# Patient Record
Sex: Male | Born: 1955 | Race: Black or African American | Hispanic: No | Smoking: Current every day smoker
Health system: Southern US, Community
[De-identification: ages and names within clinical notes are randomized; demographics above are authoritative.]

## PROBLEM LIST (undated history)

## (undated) DIAGNOSIS — Z720 Tobacco use: Secondary | ICD-10-CM

## (undated) DIAGNOSIS — I2510 Atherosclerotic heart disease of native coronary artery without angina pectoris: Secondary | ICD-10-CM

## (undated) DIAGNOSIS — F32A Depression, unspecified: Secondary | ICD-10-CM

## (undated) DIAGNOSIS — F1010 Alcohol abuse, uncomplicated: Secondary | ICD-10-CM

## (undated) DIAGNOSIS — C801 Malignant (primary) neoplasm, unspecified: Secondary | ICD-10-CM

## (undated) DIAGNOSIS — I10 Essential (primary) hypertension: Secondary | ICD-10-CM

## (undated) DIAGNOSIS — F419 Anxiety disorder, unspecified: Secondary | ICD-10-CM

## (undated) DIAGNOSIS — K219 Gastro-esophageal reflux disease without esophagitis: Secondary | ICD-10-CM

## (undated) DIAGNOSIS — F329 Major depressive disorder, single episode, unspecified: Secondary | ICD-10-CM

## (undated) DIAGNOSIS — C9000 Multiple myeloma not having achieved remission: Principal | ICD-10-CM

## (undated) HISTORY — PX: PROSTATECTOMY: SHX69

## (undated) HISTORY — PX: CORONARY STENT PLACEMENT: SHX1402

## (undated) HISTORY — PX: CORONARY ANGIOPLASTY: SHX604

---

## 2014-07-21 ENCOUNTER — Ambulatory Visit (HOSPITAL_COMMUNITY): Admit: 2014-07-21 | Payer: Self-pay | Admitting: Cardiology

## 2014-07-21 ENCOUNTER — Inpatient Hospital Stay (HOSPITAL_COMMUNITY)
Admission: EM | Admit: 2014-07-21 | Discharge: 2014-07-22 | DRG: 287 | Disposition: A | Discharge status: Home / Self Care | Payer: Non-veteran care | Attending: Cardiology | Admitting: Cardiology

## 2014-07-21 ENCOUNTER — Encounter (HOSPITAL_COMMUNITY): Payer: Self-pay | Admitting: Emergency Medicine

## 2014-07-21 ENCOUNTER — Ambulatory Visit (HOSPITAL_COMMUNITY): Admission: EM | Admit: 2014-07-21 | Payer: Self-pay | Source: Home / Self Care | Admitting: Cardiology

## 2014-07-21 ENCOUNTER — Encounter (HOSPITAL_COMMUNITY)
Admission: EM | Disposition: A | Discharge status: Home / Self Care | Payer: Non-veteran care | Source: Home / Self Care | Attending: Cardiology

## 2014-07-21 DIAGNOSIS — I10 Essential (primary) hypertension: Secondary | ICD-10-CM | POA: Diagnosis present

## 2014-07-21 DIAGNOSIS — F419 Anxiety disorder, unspecified: Secondary | ICD-10-CM | POA: Diagnosis present

## 2014-07-21 DIAGNOSIS — E785 Hyperlipidemia, unspecified: Secondary | ICD-10-CM | POA: Diagnosis present

## 2014-07-21 DIAGNOSIS — E860 Dehydration: Secondary | ICD-10-CM | POA: Diagnosis present

## 2014-07-21 DIAGNOSIS — I2510 Atherosclerotic heart disease of native coronary artery without angina pectoris: Secondary | ICD-10-CM | POA: Diagnosis present

## 2014-07-21 DIAGNOSIS — F329 Major depressive disorder, single episode, unspecified: Secondary | ICD-10-CM | POA: Diagnosis present

## 2014-07-21 DIAGNOSIS — F32A Depression, unspecified: Secondary | ICD-10-CM | POA: Diagnosis present

## 2014-07-21 DIAGNOSIS — F17210 Nicotine dependence, cigarettes, uncomplicated: Secondary | ICD-10-CM | POA: Diagnosis present

## 2014-07-21 DIAGNOSIS — K219 Gastro-esophageal reflux disease without esophagitis: Secondary | ICD-10-CM | POA: Diagnosis present

## 2014-07-21 DIAGNOSIS — I252 Old myocardial infarction: Secondary | ICD-10-CM | POA: Diagnosis not present

## 2014-07-21 DIAGNOSIS — Z955 Presence of coronary angioplasty implant and graft: Secondary | ICD-10-CM

## 2014-07-21 DIAGNOSIS — I213 ST elevation (STEMI) myocardial infarction of unspecified site: Secondary | ICD-10-CM

## 2014-07-21 DIAGNOSIS — Z7982 Long term (current) use of aspirin: Secondary | ICD-10-CM

## 2014-07-21 DIAGNOSIS — R079 Chest pain, unspecified: Secondary | ICD-10-CM | POA: Diagnosis present

## 2014-07-21 DIAGNOSIS — F1010 Alcohol abuse, uncomplicated: Secondary | ICD-10-CM | POA: Diagnosis present

## 2014-07-21 DIAGNOSIS — Z720 Tobacco use: Secondary | ICD-10-CM | POA: Diagnosis present

## 2014-07-21 DIAGNOSIS — R55 Syncope and collapse: Secondary | ICD-10-CM | POA: Diagnosis present

## 2014-07-21 HISTORY — DX: Gastro-esophageal reflux disease without esophagitis: K21.9

## 2014-07-21 HISTORY — DX: Alcohol abuse, uncomplicated: F10.10

## 2014-07-21 HISTORY — DX: Major depressive disorder, single episode, unspecified: F32.9

## 2014-07-21 HISTORY — PX: LEFT HEART CATHETERIZATION WITH CORONARY ANGIOGRAM: SHX5451

## 2014-07-21 HISTORY — DX: Tobacco use: Z72.0

## 2014-07-21 HISTORY — DX: Depression, unspecified: F32.A

## 2014-07-21 HISTORY — DX: Anxiety disorder, unspecified: F41.9

## 2014-07-21 HISTORY — DX: Atherosclerotic heart disease of native coronary artery without angina pectoris: I25.10

## 2014-07-21 HISTORY — DX: Malignant (primary) neoplasm, unspecified: C80.1

## 2014-07-21 HISTORY — DX: Essential (primary) hypertension: I10

## 2014-07-21 LAB — CBC
HCT: 25.5 % — ABNORMAL LOW (ref 39.0–52.0)
Hemoglobin: 8.6 g/dL — ABNORMAL LOW (ref 13.0–17.0)
MCH: 27.7 pg (ref 26.0–34.0)
MCHC: 33.7 g/dL (ref 30.0–36.0)
MCV: 82.3 fL (ref 78.0–100.0)
PLATELETS: 281 10*3/uL (ref 150–400)
RBC: 3.1 MIL/uL — AB (ref 4.22–5.81)
RDW: 15.9 % — ABNORMAL HIGH (ref 11.5–15.5)
WBC: 5 10*3/uL (ref 4.0–10.5)

## 2014-07-21 LAB — PROTIME-INR
INR: 1.14 (ref 0.00–1.49)
Prothrombin Time: 14.7 seconds (ref 11.6–15.2)

## 2014-07-21 LAB — PRO B NATRIURETIC PEPTIDE: Pro B Natriuretic peptide (BNP): 19 pg/mL (ref 0–125)

## 2014-07-21 LAB — I-STAT TROPONIN, ED: Troponin i, poc: 0.02 ng/mL (ref 0.00–0.08)

## 2014-07-21 SURGERY — LEFT HEART CATHETERIZATION WITH CORONARY ANGIOGRAM
Anesthesia: Choice | Laterality: Bilateral

## 2014-07-21 MED ORDER — MIDAZOLAM HCL 2 MG/2ML IJ SOLN
INTRAMUSCULAR | Status: AC
  Filled 2014-07-21: qty 2

## 2014-07-21 MED ORDER — NITROGLYCERIN 1 MG/10 ML FOR IR/CATH LAB
INTRA_ARTERIAL | Status: AC
  Filled 2014-07-21: qty 10

## 2014-07-21 MED ORDER — BIVALIRUDIN 250 MG IV SOLR
INTRAVENOUS | Status: AC
  Filled 2014-07-21: qty 250

## 2014-07-21 MED ORDER — LIDOCAINE HCL (PF) 1 % IJ SOLN
INTRAMUSCULAR | Status: AC
  Filled 2014-07-21: qty 30

## 2014-07-21 MED ORDER — HEPARIN (PORCINE) IN NACL 2-0.9 UNIT/ML-% IJ SOLN
INTRAMUSCULAR | Status: AC
  Filled 2014-07-21: qty 1000

## 2014-07-21 MED ORDER — HEPARIN SODIUM (PORCINE) 5000 UNIT/ML IJ SOLN
INTRAMUSCULAR | Status: AC
  Administered 2014-07-21: 5000 [IU]
  Filled 2014-07-21: qty 1

## 2014-07-21 MED ORDER — FENTANYL CITRATE 0.05 MG/ML IJ SOLN
INTRAMUSCULAR | Status: AC
  Filled 2014-07-21: qty 2

## 2014-07-21 NOTE — ED Provider Notes (Signed)
MSE was initiated and I personally evaluated the patient and placed orders (if any) at  11:24 PM on July 21, 2014.  PT has hx of CAD with 5 stents, the last placed about 5 years ago. Presents with chest pain that started about 10 pm tonight, left chest, with diaphoresis but no nausea or vomiting. EMS gave 324 mg of ASA and NTG x 1 and his pain improved from an 8 to an 2. EMS reports syncope x 2.  PT continues to smoke 1/2-1 ppd and drink beer.   PT is alert, cooperative. He is in no respiratory distress. Rest of exam deferred to Dr Ellyn Hack.   Dr Ellyn Hack was in the room awaiting patient's arrival to the ED and patient's care was deferred to him.     EKG Interpretation  Date/Time:  Thursday July 21 2014 23:24:57 EDT Ventricular Rate:  66 PR Interval:  166 QRS Duration: 102 QT Interval:  453 QTC Calculation: 475 R Axis:   -9 Text Interpretation:  Sinus rhythm Left ventricular hypertrophy Lateral infarct, acute (LAD) No old tracing to compare Confirmed by Chancelor Hardrick  MD-I, Cagney Steenson ([id number]) on 07/21/2014 11:33:01 PM      Diagnoses that have been ruled out:  None  Diagnoses that are still under consideration:  None  Final diagnoses:  ST elevation myocardial infarction (STEMI), unspecified artery    Plan pt taken to cath lab by Dr Luna Glasgow, MD, FACEP   The patient appears stable so that the remainder of the MSE may be completed by another provider.  Janice Norrie, MD 07/21/14 [phone]

## 2014-07-21 NOTE — ED Notes (Addendum)
Pt arrives from [employer], with c/o chest pain, syncopal episode while in the kitchen eating and started having cp. Orthostatic changes and syncopal episode again with EMS. Dizziness. One nitro SL, 324 MG ASA PTA. Initial pain was an eight. Now a two

## 2014-07-21 NOTE — Progress Notes (Signed)
Chaplain responded to code stemi. Chaplain met two of patient's friends, showed them to cath lab waiting area, and let staff know they had arrived. Will follow as needed.  Vanetta Mulders 07/21/2014 11:50 PM

## 2014-07-21 NOTE — ED Notes (Signed)
Pt transported to Cath Lab with this RN and Ellyn Hack MD

## 2014-07-22 ENCOUNTER — Encounter (HOSPITAL_COMMUNITY): Payer: Self-pay | Admitting: *Deleted

## 2014-07-22 ENCOUNTER — Inpatient Hospital Stay (HOSPITAL_COMMUNITY): Payer: Non-veteran care

## 2014-07-22 DIAGNOSIS — K219 Gastro-esophageal reflux disease without esophagitis: Secondary | ICD-10-CM | POA: Diagnosis present

## 2014-07-22 DIAGNOSIS — Z720 Tobacco use: Secondary | ICD-10-CM | POA: Diagnosis present

## 2014-07-22 DIAGNOSIS — R55 Syncope and collapse: Principal | ICD-10-CM

## 2014-07-22 DIAGNOSIS — F32A Depression, unspecified: Secondary | ICD-10-CM | POA: Diagnosis present

## 2014-07-22 DIAGNOSIS — R079 Chest pain, unspecified: Secondary | ICD-10-CM

## 2014-07-22 DIAGNOSIS — F419 Anxiety disorder, unspecified: Secondary | ICD-10-CM | POA: Diagnosis present

## 2014-07-22 DIAGNOSIS — F1010 Alcohol abuse, uncomplicated: Secondary | ICD-10-CM | POA: Diagnosis present

## 2014-07-22 DIAGNOSIS — I10 Essential (primary) hypertension: Secondary | ICD-10-CM | POA: Diagnosis present

## 2014-07-22 DIAGNOSIS — I2510 Atherosclerotic heart disease of native coronary artery without angina pectoris: Secondary | ICD-10-CM | POA: Diagnosis present

## 2014-07-22 DIAGNOSIS — F329 Major depressive disorder, single episode, unspecified: Secondary | ICD-10-CM | POA: Diagnosis present

## 2014-07-22 LAB — BASIC METABOLIC PANEL
ANION GAP: 15 (ref 5–15)
Anion gap: 12 (ref 5–15)
BUN: 12 mg/dL (ref 6–23)
BUN: 13 mg/dL (ref 6–23)
CALCIUM: 9.1 mg/dL (ref 8.4–10.5)
CO2: 19 mEq/L (ref 19–32)
CO2: 22 meq/L (ref 19–32)
Calcium: 8.9 mg/dL (ref 8.4–10.5)
Chloride: 100 mEq/L (ref 96–112)
Chloride: 102 mEq/L (ref 96–112)
Creatinine, Ser: 1.44 mg/dL — ABNORMAL HIGH (ref 0.50–1.35)
Creatinine, Ser: 1.36 mg/dL — ABNORMAL HIGH (ref 0.50–1.35)
GFR calc Af Amer: 60 mL/min — ABNORMAL LOW (ref >90)
GFR calc Af Amer: 64 mL/min — ABNORMAL LOW (ref >90)
GFR, EST NON AFRICAN AMERICAN: 51 mL/min — AB (ref >90)
GFR, EST NON AFRICAN AMERICAN: 55 mL/min — AB (ref >90)
GLUCOSE: 117 mg/dL — AB (ref 70–99)
Glucose, Bld: 100 mg/dL — ABNORMAL HIGH (ref 70–99)
POTASSIUM: 4.3 meq/L (ref 3.7–5.3)
Potassium: 4.5 mEq/L (ref 3.7–5.3)
SODIUM: 134 meq/L — AB (ref 137–147)
SODIUM: 136 meq/L — AB (ref 137–147)

## 2014-07-22 LAB — CBC WITH DIFFERENTIAL/PLATELET
Basophils Absolute: 0 10*3/uL (ref 0.0–0.1)
Basophils Relative: 0 % (ref 0–1)
EOS ABS: 0.2 10*3/uL (ref 0.0–0.7)
EOS PCT: 2 % (ref 0–5)
HEMATOCRIT: 27.2 % — AB (ref 39.0–52.0)
Hemoglobin: 8.9 g/dL — ABNORMAL LOW (ref 13.0–17.0)
Lymphocytes Relative: 16 % (ref 12–46)
Lymphs Abs: 1.8 10*3/uL (ref 0.7–4.0)
MCH: 27.6 pg (ref 26.0–34.0)
MCHC: 32.7 g/dL (ref 30.0–36.0)
MCV: 84.5 fL (ref 78.0–100.0)
MONO ABS: 0.7 10*3/uL (ref 0.1–1.0)
Monocytes Relative: 7 % (ref 3–12)
Neutro Abs: 8.2 10*3/uL — ABNORMAL HIGH (ref 1.7–7.7)
Neutrophils Relative %: 75 % (ref 43–77)
PLATELETS: 301 10*3/uL (ref 150–400)
RBC: 3.22 MIL/uL — ABNORMAL LOW (ref 4.22–5.81)
RDW: 16 % — AB (ref 11.5–15.5)
WBC: 10.9 10*3/uL — ABNORMAL HIGH (ref 4.0–10.5)

## 2014-07-22 LAB — TROPONIN I
Troponin I: <0.3 ng/mL (ref <0.30)
Troponin I: 0.32 ng/mL (ref <0.30)

## 2014-07-22 LAB — TSH: TSH: 0.875 u[IU]/mL (ref 0.350–4.500)

## 2014-07-22 LAB — MRSA PCR SCREENING: MRSA BY PCR: POSITIVE — AB

## 2014-07-22 LAB — VITAMIN B12: VITAMIN B 12: 540 pg/mL (ref 211–911)

## 2014-07-22 LAB — ETHANOL: ALCOHOL ETHYL (B): 12 mg/dL — AB (ref 0–11)

## 2014-07-22 LAB — FERRITIN: Ferritin: 14 ng/mL — ABNORMAL LOW (ref 22–322)

## 2014-07-22 SURGERY — Surgical Case
Anesthesia: *Unknown

## 2014-07-22 MED ORDER — ATORVASTATIN CALCIUM 80 MG PO TABS
80.0000 mg | ORAL_TABLET | Freq: Every day | ORAL | Status: DC
  Filled 2014-07-22: qty 1

## 2014-07-22 MED ORDER — SODIUM CHLORIDE 0.9 % IV SOLN: 250.0000 mL | INTRAVENOUS | Status: DC | PRN

## 2014-07-22 MED ORDER — NITROGLYCERIN 0.4 MG SL SUBL: 0.4000 mg | SUBLINGUAL_TABLET | SUBLINGUAL | Status: AC | PRN

## 2014-07-22 MED ORDER — ASPIRIN EC 81 MG PO TBEC
81.0000 mg | DELAYED_RELEASE_TABLET | Freq: Every day | ORAL | Status: DC
  Administered 2014-07-22: 81 mg via ORAL
  Filled 2014-07-22: qty 1

## 2014-07-22 MED ORDER — ADULT MULTIVITAMIN W/MINERALS CH: 1.0000 | ORAL_TABLET | Freq: Every day | ORAL | Status: DC

## 2014-07-22 MED ORDER — MUPIROCIN 2 % EX OINT
1.0000 | TOPICAL_OINTMENT | Freq: Two times a day (BID) | CUTANEOUS | Status: DC
Start: 2014-07-22 — End: 2014-07-22
  Administered 2014-07-22: 1 via NASAL
  Filled 2014-07-22: qty 22

## 2014-07-22 MED ORDER — SODIUM CHLORIDE 0.9 % IV SOLN
INTRAVENOUS | Status: DC
Start: 2014-07-22 — End: 2014-07-22

## 2014-07-22 MED ORDER — SODIUM CHLORIDE 0.9 % IJ SOLN: 3.0000 mL | INTRAMUSCULAR | Status: DC | PRN

## 2014-07-22 MED ORDER — SODIUM CHLORIDE 0.9 % IJ SOLN
3.0000 mL | Freq: Two times a day (BID) | INTRAMUSCULAR | Status: DC
  Administered 2014-07-22 (×2): 3 mL via INTRAVENOUS

## 2014-07-22 MED ORDER — MORPHINE SULFATE 2 MG/ML IJ SOLN: 2.0000 mg | INTRAMUSCULAR | Status: DC | PRN

## 2014-07-22 MED ORDER — NITROGLYCERIN 0.4 MG SL SUBL: 0.4000 mg | SUBLINGUAL_TABLET | SUBLINGUAL | Status: DC | PRN

## 2014-07-22 MED ORDER — AMLODIPINE BESYLATE 10 MG PO TABS
10.0000 mg | ORAL_TABLET | Freq: Every day | ORAL | Status: DC
  Administered 2014-07-22: 10 mg via ORAL
  Filled 2014-07-22: qty 1

## 2014-07-22 MED ORDER — SODIUM CHLORIDE 0.9 % IJ SOLN
3.0000 mL | Freq: Two times a day (BID) | INTRAMUSCULAR | Status: DC
  Administered 2014-07-22: 3 mL via INTRAVENOUS

## 2014-07-22 MED ORDER — LISINOPRIL 10 MG PO TABS: 10.0000 mg | ORAL_TABLET | Freq: Every day | ORAL | Status: AC

## 2014-07-22 MED ORDER — VERAPAMIL HCL 2.5 MG/ML IV SOLN
INTRAVENOUS | Status: AC
  Filled 2014-07-22: qty 2

## 2014-07-22 MED ORDER — LISINOPRIL 10 MG PO TABS
10.0000 mg | ORAL_TABLET | Freq: Every day | ORAL | Status: DC
  Administered 2014-07-22: 10 mg via ORAL
  Filled 2014-07-22: qty 1

## 2014-07-22 MED ORDER — ENOXAPARIN SODIUM 40 MG/0.4ML ~~LOC~~ SOLN
40.0000 mg | Freq: Every day | SUBCUTANEOUS | Status: DC
  Administered 2014-07-22: 40 mg via SUBCUTANEOUS
  Filled 2014-07-22: qty 0.4

## 2014-07-22 MED ORDER — METOPROLOL TARTRATE 25 MG PO TABS: 25.0000 mg | ORAL_TABLET | Freq: Two times a day (BID) | ORAL | Status: AC

## 2014-07-22 MED ORDER — ADULT MULTIVITAMIN W/MINERALS CH
1.0000 | ORAL_TABLET | Freq: Every day | ORAL | Status: DC
  Administered 2014-07-22: 1 via ORAL
  Filled 2014-07-22: qty 1

## 2014-07-22 MED ORDER — SERTRALINE HCL 50 MG PO TABS
50.0000 mg | ORAL_TABLET | Freq: Every day | ORAL | Status: DC
  Administered 2014-07-22: 50 mg via ORAL
  Filled 2014-07-22: qty 1

## 2014-07-22 MED ORDER — HEPARIN SODIUM (PORCINE) 5000 UNIT/ML IJ SOLN: 5000.0000 [IU] | Freq: Three times a day (TID) | INTRAMUSCULAR | Status: DC

## 2014-07-22 MED ORDER — CHLORHEXIDINE GLUCONATE CLOTH 2 % EX PADS: 6.0000 | MEDICATED_PAD | Freq: Every day | CUTANEOUS | Status: DC

## 2014-07-22 MED ORDER — METOPROLOL TARTRATE 25 MG PO TABS
25.0000 mg | ORAL_TABLET | Freq: Two times a day (BID) | ORAL | Status: DC
  Administered 2014-07-22 (×2): 25 mg via ORAL
  Filled 2014-07-22 (×3): qty 1

## 2014-07-22 MED ORDER — ASPIRIN 81 MG PO TBEC: 81.0000 mg | DELAYED_RELEASE_TABLET | Freq: Every day | ORAL | Status: AC

## 2014-07-22 MED ORDER — SODIUM CHLORIDE 0.9 % IV SOLN: INTRAVENOUS | Status: DC

## 2014-07-22 MED ORDER — ACETAMINOPHEN 325 MG PO TABS: 650.0000 mg | ORAL_TABLET | ORAL | Status: DC | PRN

## 2014-07-22 MED ORDER — AMLODIPINE BESYLATE 10 MG PO TABS: 10.0000 mg | ORAL_TABLET | Freq: Every day | ORAL | Status: AC

## 2014-07-22 MED ORDER — ATORVASTATIN CALCIUM 80 MG PO TABS
80.0000 mg | ORAL_TABLET | Freq: Every day | ORAL | Status: AC
Start: 2014-07-22 — End: ?

## 2014-07-22 MED ORDER — ADULT MULTIVITAMIN W/MINERALS CH: 1.0000 | ORAL_TABLET | Freq: Every day | ORAL | Status: AC

## 2014-07-22 MED ORDER — ONDANSETRON HCL 4 MG/2ML IJ SOLN: 4.0000 mg | Freq: Four times a day (QID) | INTRAMUSCULAR | Status: DC | PRN

## 2014-07-22 MED ORDER — SERTRALINE HCL 50 MG PO TABS: 50.0000 mg | ORAL_TABLET | Freq: Every day | ORAL | Status: AC

## 2014-07-22 MED FILL — Sodium Chloride IV Soln 0.9%: INTRAVENOUS | Qty: 50 | Status: AC

## 2014-07-22 NOTE — CV Procedure (Signed)
CARDIAC CATHETERIZATION REPORT  NAME:  Jeffrey Peterson   MRN: [id number] DOB:  Feb 09, 1956   ADMIT DATE: 07/21/2014 Procedure Date: 07/22/2014  INTERVENTIONAL CARDIOLOGIST: Leonie Man, M.D., MS PRIMARY CARE PROVIDER: No primary provider on file. PRIMARY CARDIOLOGIST: [place]  PATIENT:  Jeffrey Peterson is a 58 y.o. male history no CAD - by his report, he has 5 stents but he does not know the location or type. His last catheterization and stent was 5 years ago. He also has hypertension, hyperlipidemia obesity and is a chronic smoker of at least 3/4 pack per day. He was in his usual state of health visiting friends in [place] when he had a syncopal episode roughly 10:15 PM. Shortly after arising from this episode he started noticing left-sided/substernal chest tightness and pressure. He was somewhat sweaty and clammy but denied any dyspnea or nausea. The syncopal episode left roughly 3 minutes loss of consciousness by bystanders report. There was no sensory palpitations or irregular/rapid heartbeats prior to this. He was seated at a bar drinking. He had chest pain EMS was contacted. Upon arrival he had. The roughly 1 mm ST elevations in leads 1 aVL changes in 3 and aVF that was concerning for possible Lateral STEMI.  [facility] identified the EKG as a STEMI, and activated Code STEMI.  He [facility] at [facility] emergency room at 11:15. The pain was down to roughly 2/10. Per EMS he was very orthostatic and dizzy upon their initial evaluation. When he arrived in her room he was also somewhat orthostatic with sitting up he only began feeling better when he would lie back down again.. Based on the EKG changes, his known history of CAD with a history of syncope and chest pain, I decided to the cardiac catheterization for invasive evaluation.  PRE-OPERATIVE DIAGNOSIS:    Syncope  Possible Lateral ST Elevation MI  Known CAD Status Post PCI  PROCEDURES PERFORMED:      Left Heart Catheterization with Native Coronary Angiography  via Right Radial Artery   Left Ventriculography  PROCEDURE: The patient was brought to the [facility] in the fasting state and prepped and draped in the usual sterile fashion for Right Radial artery access. A modified Allen's test was performed on the right wrist demonstrating excellent collateral flow for radial access.   Sterile technique was used including antiseptics, cap, gloves, gown, hand hygiene, mask and sheet. Skin prep: Chlorhexidine.   Consent: Risks of procedure as well as the alternatives and risks of each were explained to the (patient/caregiver). Consent for procedure obtained.   Time Out: Verified patient identification, verified procedure, site/side was marked, verified correct patient position, special equipment/implants available, medications/allergies/relevent history reviewed, required imaging and test results available. Performed.  Access:   Right Radial Artery: 6 Fr Sheath -  Seldinger Technique (Angiocath Micropuncture Kit)  Radial Cocktail - 10 mL;   Left Heart Catheterization: 5 Fr Catheters advanced or exchanged over a Long Exchange Safety J-wire; TIG 4.0 catheter advanced first.  Left & Right Coronary Artery Cineangiography: TIG 4.0 Catheter   LV Hemodynamics (LV Gram): Angled Pigtail  Sheath removed in the cardiac catheterization lab with TR band placement for hemostasis.  TR Band: 0015  Hours; 13 mL air  FINDINGS:  Hemodynamics:   Central Aortic Pressure / Mean: 121/69/93 mmHg  Left Ventricular Pressure / LVEDP: 121/9/15 mmHg  Left Ventriculography:  EF: 70-75 %  Wall Motion: Hyperdynamic  Coronary Anatomy: Large caliber vessels,  very difficult to fully opacify with contrast  Dominance: Right  Left Main:  Large caliber vessel that bifurcates into the LAD and Circumflex; angiographically normal; there does appear to be a small caliber proximal  septal perforator versus ramus intermedius the does not fill well. LAD: Large caliber vessel that gives off 2 major diagonal branches in the early proximal to mid section. There appears to be a stent crosses the second diagonal branch. It is widely patent. The vessel continues as a large caliber vessel that wraps down around the apex perfusing the distal one third of the infero-apex. Essentially angiographically normal.  D1: Moderate caliber vessel with a proximal bifurcation. One of the branches of the bifurcation is probably 50% stenosis. This more proximal branch covers a larger distribution than the second branch.  D2: Smaller moderate caliber vessel that bifurcates distally. Minimal luminal irregularities.  Left Circumflex: Large caliber, nondominant vessel that gives off a small AV groove branch before terminating as a large lateral OM. The lateral OM has proximal 40% stenosis before and after a small proximal branch. It then bifurcates into 2 moderate caliber branches distally. There does appear to be a widely patent stent in between that proximal branch and the bifurcation. Otherwise minimal luminal irregularities noted.   RCA: Large caliber, dominant vessel that gives off a major moderate caliber artery and marginal branch. The RCA based distally into the Right Posterior AV Groove Branch (RPAV) In the Right Posterior Descending Artery (RPDA). There are mild luminal irregularities throughout the RCA but no significant lesions. Overall the distal branches of the RCA are relatively small in comparison to the left coronary system branches.  RPDA: Moderate caliber vessel with an ostial 60% stenosis. The vessel reaches about half way to the apex.  RPL Sysytem:The RPAV begins a moderate caliber vessel and gives off several small posterolateral branches.  After reviewing the initial angiography, no culprit lesion was identified.  MEDICATIONS:  Anesthesia:  Local Lidocaine 2 ml  Sedation:  1 mg  IV Versed, 25 mcg IV fentanyl ;   Premedication: 4000 Units IV Heparin, 324 mg ASA  Omnipaque Contrast: 100 ml Radial Cocktail: 5 mg Verapamil, 400 mcg NTG, 2 ml 2% Lidocaine in 10 ml NS 250 normal saline bolus  PATIENT DISPOSITION:    The patient was transferred to the PACU holding area in a hemodynamicaly stable, chest pain free condition.  The patient tolerated the procedure well, and there were no complications.  EBL:   < 5 ml  The patient was stable before, during, and after the procedure.  POST-OPERATIVE DIAGNOSIS:    Angiographically only moderate disease as with roughly 60% ostial PDA and a 60% stenosis in the bifurcation of D1  Hyperdynamic left ventricular function with mildly elevated LVEDP; After IV hydration.    PLAN OF CARE:  Admit to Stepdown Unit for postcatheterization care and evaluation of syncope.  Ambulate in the morning after IV hydration to see if he continues to be symptomatic with orthostatic hypotension.  Check orthostatic blood pressures in the morning.    Leonie Man, M.D., M.S. Interventional Cardiologist   Pager # [phone]

## 2014-07-22 NOTE — Discharge Summary (Signed)
Discharge Summary   Patient ID: Jeffrey Peterson,  MRN: [id number], DOB/AGE: 1956/02/19 58 y.o.  Admit date: 07/21/2014 Discharge date: 07/22/2014  Primary Care Provider: No PCP Per Patient Primary Cardiologist: [employer], [place]  Discharge Diagnoses Principal Problem:   Syncope and collapse Active Problems:   CAD (coronary artery disease)   Hypertension   Tobacco abuse   GERD (gastroesophageal reflux disease)   Depression   Anxiety   ETOH abuse  Allergies No Known Allergies  Procedures  Cardiac Catheterization 10.15.2015  Hemodynamics:    Central Aortic Pressure / Mean: 121/69/93 mmHg  Left Ventricular Pressure / LVEDP: 121/9/15 mmHg  Left Ventriculography:  EF: 70-75 %  Wall Motion: Hyperdynamic  Coronary Anatomy: Large caliber vessels, very difficult to fully opacify with contrast  Dominance: Right  Left Main:  Large caliber vessel that bifurcates into the LAD and Circumflex; angiographically normal; there does appear to be a small caliber proximal septal perforator versus ramus intermedius the does not fill well. LAD: Large caliber vessel that gives off 2 major diagonal branches in the early proximal to mid section. There appears to be a stent crosses the second diagonal branch. It is widely patent. The vessel continues as a large caliber vessel that wraps down around the apex perfusing the distal one third of the infero-apex. Essentially angiographically normal.  D1: Moderate caliber vessel with a proximal bifurcation. One of the branches of the bifurcation is probably 50% stenosis. This more proximal branch covers a larger distribution than the second branch.  D2: Smaller moderate caliber vessel that bifurcates distally. Minimal luminal irregularities.  Left Circumflex: Large caliber, nondominant vessel that gives off a small AV groove branch before terminating as a large lateral OM. The lateral OM has proximal 40% stenosis before and after a small  proximal branch. It then bifurcates into 2 moderate caliber branches distally. There does appear to be a widely patent stent in between that proximal branch and the bifurcation. Otherwise minimal luminal irregularities noted.     RCA: Large caliber, dominant vessel that gives off a major moderate caliber artery and marginal branch. The RCA based distally into the Right Posterior AV Groove Branch (RPAV) In the Right Posterior Descending Artery (RPDA). There are mild luminal irregularities throughout the RCA but no significant lesions. Overall the distal branches of the RCA are relatively small in comparison to the left coronary system branches.  RPDA: Moderate caliber vessel with an ostial 60% stenosis. The vessel reaches about half way to the apex.  RPL Sysytem:The RPAV begins a moderate caliber vessel and gives off several small posterolateral branches.  After reviewing the initial angiography, no culprit lesion was identified. _____________   History of Present Illness  58 y/o male with a h/o CAD s/p multiple PCI's and followed by cardiology in [place], [place].  He was in his usual state of health until the evening of 10/15, when he was at home sitting in his kitchen and suddenly lost consciousness without warning.  He regained consciousness while on the floor and developed substernal chest pain, which was similar to prior MI symptoms.  EMS was called and upon their arrival, he sat up and had recurrent syncope.  He was treated with IV fluids and taken to the [facility] ED.  In the ED, he was found to have J point elevation in leads I, aVL, and V@ with TWI in III and aVF.  A Code STEMI was called and the cath lab was activated.  Hospital Course  Patient underwent emergent  diagnostic catheterization revealing non-obstructive CAD and normal LV function.  No targets for intervention were noted.  He was transferred to the coronary ICU and continued on medical therapy.  Labs revealed mild normocytic anemia,  renal insufficiency, and mild troponin elevation to 0.32.  This was felt to be secondary to demand ischemia in the setting of syncope/hypotension/dehydration.  The pts wife reported that the pt has been drinking heavily recently - likely contributing to his syncope.    Pt ambulated this AM and was felt to be stable for discharge.  He was counseled on ETOH cessation.  He reports that he has chronic anemia and is followed by his PCP in [place].  He will also f/u with his primary cardiologist in [place].   Discharge Vitals Blood pressure 179/90, pulse 77, temperature 97.4 F (36.3 C), temperature source Oral, resp. rate 20, height 5\' 10"  (1.778 m), weight 235 lb 10.8 oz (106.9 kg), SpO2 100.00%.  Filed Weights   07/21/14 2324 07/22/14 0033 07/22/14 0300  Weight: 246 lb (111.585 kg) 237 lb 3.4 oz (107.6 kg) 235 lb 10.8 oz (106.9 kg)   Labs  CBC  Recent Labs  07/21/14 2326 07/22/14 0120  WBC 5.0 10.9*  NEUTROABS  --  8.2*  HGB 8.6* 8.9*  HCT 25.5* 27.2*  MCV 82.3 84.5  PLT 281 782   Basic Metabolic Panel  Recent Labs  07/21/14 2326 07/22/14 0740  NA 134* 136*  K 4.3 4.5  CL 100 102  CO2 19 22  GLUCOSE 100* 117*  BUN 12 13  CREATININE 1.44* 1.36*  CALCIUM 9.1 8.9   Cardiac Enzymes  Recent Labs  07/22/14 0120 07/22/14 0740  TROPONINI <0.30 0.32*   Thyroid Function Tests  Recent Labs  07/22/14 0740  TSH 0.875   Disposition  Pt is being discharged home today in good condition.  Follow-up Plans & Appointments      Follow-up Information   Follow up with [facility]. (2-3 wks.)       Discharge Medications    Medication List         amLODipine 10 MG tablet  Commonly known as:  NORVASC  Take 1 tablet (10 mg total) by mouth daily.     aspirin 81 MG EC tablet  Take 1 tablet (81 mg total) by mouth daily.     atorvastatin 80 MG tablet  Commonly known as:  LIPITOR  Take 1 tablet (80 mg total) by mouth daily at 6 PM.     lisinopril 10 MG  tablet  Commonly known as:  PRINIVIL,ZESTRIL  Take 1 tablet (10 mg total) by mouth daily.     metoprolol tartrate 25 MG tablet  Commonly known as:  LOPRESSOR  Take 1 tablet (25 mg total) by mouth 2 (two) times daily.     multivitamin with minerals Tabs tablet  Take 1 tablet by mouth daily.     nitroGLYCERIN 0.4 MG SL tablet  Commonly known as:  NITROSTAT  Place 1 tablet (0.4 mg total) under the tongue every 5 (five) minutes x 3 doses as needed for chest pain.     sertraline 50 MG tablet  Commonly known as:  ZOLOFT  Take 1 tablet (50 mg total) by mouth daily.       Outstanding Labs/Studies  None  Duration of Discharge Encounter   Greater than 30 minutes including physician time.  SignedMurray Hodgkins NP 07/22/2014, 12:05 PM

## 2014-07-22 NOTE — Discharge Instructions (Signed)

## 2014-07-22 NOTE — Progress Notes (Signed)
Patient discharged home, patient did walk once more with Cardiac Rehab RN, tolerated well, VSS.  Discharge instructions given:  -Meds were reviewed -Patient was giving smoking cessation education -Patient was instructed on using his  Home BP monitor and to monitor is BP before and after BP meds -Patient was instructed on obtaining MD appts with his PCP and Cardiologist back home  Patient tolerated discharge

## 2014-07-22 NOTE — Care Management Note (Signed)
    Page 1 of 1   07/22/2014     8:30:12 AM CARE MANAGEMENT NOTE 07/22/2014  Patient:  NIRVAN, LABAN   Account Number:  [id number]  Date Initiated:  07/22/2014  Documentation initiated by:  Elissa Hefty  Subjective/Objective Assessment:   adm w syncope ,stemi called in ed     Action/Plan:   lives alone   Anticipated DC Date:     Anticipated DC Plan:           Choice offered to / List presented to:             Status of service:   Medicare Important Message given?   (If response is "NO", the following Medicare IM given date fields will be blank) Date Medicare IM given:   Medicare IM given by:   Date Additional Medicare IM given:   Additional Medicare IM given by:    Discharge Disposition:    Per UR Regulation:  Reviewed for med. necessity/level of care/duration of stay  If discussed at [place] of Stay Meetings, dates discussed:    Comments:

## 2014-07-22 NOTE — Progress Notes (Signed)
CARDIAC REHAB PHASE I   PRE:  Rate/Rhythm: 64 SR  BP:  Supine:   Sitting: 163/77  Standing: 187/95   SaO2: 100%RA  MODE:  Ambulation: 700 ft   POST:  Rate/Rhythm: 80 SR  BP:  Supine:   Sitting: 183/87, 185/84  Standing:    SaO2:  1105-1155 Pt walked 700 ft with steady pace independently with wife and we monitored heart rate. Pt denied dizziness. BP elevated before and after walk and RN aware. Discussed with pt CRP 2 which he has attended before in [place]. Pt gave permission to refer to [facility] as he considers re attending. Gave smoking cessation handouts and pt stated plans to quit cold [place]. Pt also stated he plans to decrease alcohol and work on decreasing stress. Wife is nutritionist and to help him with healthier eating.    [name], RN BSN  07/22/2014 11:49 AM

## 2014-07-22 NOTE — Progress Notes (Addendum)
Patient Name: Jeffrey Peterson Date of Encounter: 07/22/2014     Active Problems:   Syncope and collapse    SUBJECTIVE  Feels well this am. Slightly "woozy" when he sits up. Orthostatic BP checks show no drop in BP. No chest pain or dyspnea.  CURRENT MEDS . amLODipine  10 mg Oral Daily  . aspirin EC  81 mg Oral Daily  . atorvastatin  80 mg Oral q1800  . Chlorhexidine Gluconate Cloth  6 each Topical Q0600  . enoxaparin (LOVENOX) injection  40 mg Subcutaneous Daily  . metoprolol tartrate  25 mg Oral BID  . multivitamin with minerals  1 tablet Oral Daily  . mupirocin ointment  1 application Nasal BID  . sertraline  50 mg Oral Daily  . sodium chloride  3 mL Intravenous Q12H  . sodium chloride  3 mL Intravenous Q12H    OBJECTIVE  Filed Vitals:   07/22/14 0719 07/22/14 0721 07/22/14 0728 07/22/14 0800  BP: 152/76 162/66 160/85 163/79  Pulse: 71 65  60  Temp:      TempSrc:      Resp: 15 15 25 20   Height:      Weight:      SpO2: 99% 99%  98%    Intake/Output Summary (Last 24 hours) at 07/22/14 0831 Last data filed at 07/22/14 0700  Gross per 24 hour  Intake    750 ml  Output    800 ml  Net    -50 ml   Filed Weights   07/21/14 2324 07/22/14 0033 07/22/14 0300  Weight: 246 lb (111.585 kg) 237 lb 3.4 oz (107.6 kg) 235 lb 10.8 oz (106.9 kg)    PHYSICAL EXAM  General: Pleasant, NAD. Neuro: Alert and oriented X 3. Moves all extremities spontaneously. Psych: Normal affect. HEENT:  Normal  Neck: Supple without bruits or JVD. Lungs:  Resp regular and unlabored, CTA. Heart: RRR no s3, s4, or murmurs. Abdomen: Soft, non-tender, non-distended, BS + x 4.  Extremities: No clubbing, cyanosis or edema. DP/PT/Radials 2+ and equal bilaterally. Right radial pulse good. Accessory Clinical Findings  CBC  Recent Labs  07/21/14 2326 07/22/14 0120  WBC 5.0 10.9*  NEUTROABS  --  8.2*  HGB 8.6* 8.9*  HCT 25.5* 27.2*  MCV 82.3 84.5  PLT 281 329   Basic Metabolic  Panel  Recent Labs  07/21/14 2326  NA 134*  K 4.3  CL 100  CO2 19  GLUCOSE 100*  BUN 12  CREATININE 1.44*  CALCIUM 9.1   Liver Function Tests No results found for this basename: AST, ALT, ALKPHOS, BILITOT, PROT, ALBUMIN,  in the last 72 hours No results found for this basename: LIPASE, AMYLASE,  in the last 72 hours Cardiac Enzymes  Recent Labs  07/22/14 0120  TROPONINI <0.30   BNP No components found with this basename: POCBNP,  D-Dimer No results found for this basename: DDIMER,  in the last 72 hours Hemoglobin A1C No results found for this basename: HGBA1C,  in the last 72 hours Fasting Lipid Panel No results found for this basename: CHOL, HDL, LDLCALC, TRIG, CHOLHDL, LDLDIRECT,  in the last 72 hours Thyroid Function Tests No results found for this basename: TSH, T4TOTAL, FREET3, T3FREE, THYROIDAB,  in the last 72 hours  TELE  NSR  ECG  Reviewed by me.  NSR. Benign early repolarization. LVH. No significant change from admission.  Radiology/Studies  Reviewed by me. Mild cardiomegaly. Hypoventilation with vascular crowding.  ASSESSMENT AND PLAN 1. Syncope  probably multifactorial--dehydration, excessive alcohol, increased stress re divorce.  2. Chest pain --> resolved.  Cath showed no culprit lesion. Stents patent. 3. Tobacco abuse  4. Excessive ETOH  5. HTN  6. Hyperlipidemia  7. Known CAD s/p multiple PCIs.  8. Anemia. Repeat Hgb stable.  9. Positive nasal swab for MRSA  Plan: Will restart lower dose of lisinopril. Continue ASA, BB, statin, amlodipine, sertraline. Hold off on starting HCTZ yet. Medical and cardiology followup in [place]. Probable discharge later this am if he does well with ambulation.  Signed, Darlin Coco MD  Addendum: the patient did well with his walking in hall.  Okay for discharge home. I emphasized the importance of following up with his doctors in [place] next week. He states his doctors there have been aware of his  anemia. Borderline troponin elevation consistent with his known CAD and supply/demand mismatch secondary to hypotension and syncope.

## 2014-07-22 NOTE — H&P (Addendum)
History and Physical  Patient ID: Jeffrey Peterson MRN: [id number], SOB: 1956-03-16 58 y.o. Date of Encounter: 07/22/2014, 12:10 AM  Primary Physician: Odetta Pink with [facility] Primary Cardiologist: [facility] in [place], does not recall name  Chief Complaint: chest pain, syncope --> called STEMI  HPI: 58 y.o. male w/ PMHx significant for HTN, CAD s/p multiple PCIs, tobacco abuse who presented to [facility] on 07/22/2014 with complaints of chest pain and syncope. While at home, he was sitting at his kitchen bar eating dinner and passed out, waking up on the floor. No prodrome, no warning. While on the floor, had the onset of chest pain. Substernal, felt similar to prior events in the past. EMS arrived and sat him up, resulting in another syncopal event. No arrhythmias noted. Bolused with fluids during transport.  In ED, still with 3/10 chest pain. EKG with J point elevation in I, AVL, V2 and TWI in III and aVF.  Taken emergently to the cath lab.  Denies any bleeding issues. Still smokes 3/4 ppd. Drinks somtiems up to 6 pack/day. Never had withdrawal problems.  Past Medical History  Diagnosis Date  . Hypertension   . CAD (coronary artery disease)     5 stents placed  . GERD (gastroesophageal reflux disease)   . Depression   . Anxiety      Surgical History:  Past Surgical History  Procedure Laterality Date  . Coronary stent placement      five stents placed 2010   s/p parthyroidectomy S/p prostatectomy   Home Meds: Prior to Admission medications   Not on File  Amlodipine 10 Metoprolol Sertraline Aspirin 325 Atorvastatin Lisinopril 40 hctz 25   Allergies: No Known Allergies  History   Social History  . Marital Status: N/A    Spouse Name: N/A    Number of Children: N/A  . Years of Education: N/A   Occupational History  . Not on file.   Social History Main Topics  . Smoking status: Current Every Day Smoker    Types: Cigarettes  . Smokeless  tobacco: Not on file  . Alcohol Use: Yes  . Drug Use: Not on file  . Sexual Activity: Not on file   Other Topics Concern  . Not on file   Social History Narrative  . No narrative on file     No family history on file.  Review of Systems: General: negative for chills, fever, night sweats or weight changes.  Cardiovascular: see HPI Dermatological: negative for rash Respiratory: negative for cough or wheezing Urologic: negative for hematuria Abdominal: negative for nausea, vomiting, diarrhea, bright red blood per rectum, melena, or hematemesis Neurologic: negative for visual changes, syncope, or dizziness All other systems reviewed and are otherwise negative except as noted above.  Labs:   Lab Results  Component Value Date   WBC 5.0 07/21/2014   HGB 8.6* 07/21/2014   HCT 25.5* 07/21/2014   MCV 82.3 07/21/2014   PLT 281 07/21/2014   No results found for this basename: NA, K, CL, CO2, BUN, CREATININE, CALCIUM, LABALBU, PROT, BILITOT, ALKPHOS, ALT, AST, GLUCOSE,  in the last 168 hours No results found for this basename: CKTOTAL, CKMB, TROPONINI,  in the last 72 hours No results found for this basename: CHOL, HDL, LDLCALC, TRIG   No results found for this basename: DDIMER    Radiology/Studies:  No results found.   EKG: see HPI  Physical Exam Blood pressure 117/68, pulse 69, temperature 97.6 F (36.4 C), temperature source Oral,  resp. rate 17, height 6\' 2"  (1.88 m), weight 111.585 kg (246 lb), SpO2 100.00%. General: Well developed, well nourished, in no acute distress. Head: Normocephalic, atraumatic, sclera non-icteric, nares are without discharge Neck: Supple. Negative for carotid bruits. JVD not elevated. Lungs: Clear bilaterally to auscultation without wheezes, rales, or rhonchi. Breathing is unlabored. Heart: RRR with S1 S2. No murmurs, rubs, or gallops appreciated. Abdomen: Soft, non-tender, non-distended with normoactive bowel sounds. No rebound/guarding. No  obvious abdominal masses. Msk:  Strength and tone appear normal for age. Extremities: trace edema. No clubbing or cyanosis. Distal pedal pulses are 2+ and equal bilaterally. Neuro: Alert and oriented X 3. Moves all extremities spontaneously. Psych:  Responds to questions appropriately with a normal affect.   Prelim Cath: LV gram with ef > 70%. LVEDp 12 mmhg No culprit lesions. Prior stents patent.   Problem List 1. Syncope 2. Chest pain --> resolved 3. Tobacco abuse 4. Excessive ETOH 5. HTN 6. Hyperlipidemia 7. Known CAD s/p multiple PCIs. 8. Anemia? Repeat pending to determine if lab error.  ASSESSMENT AND PLAN:  58 y.o. male w/ PMHx significant for HTN, CAD s/p multiple PCIs, tobacco abuse who presented to [facility] on 07/22/2014 with complaints of syncope and subsequent chest pain with EKG borderline for ST elevations.  No obstructive lesions seen on catheterization.   Wife arrived after the catheterization and provided additional history: drinks 4-6 beers a day. Under a lot of stress. Missing doctors appoints. Drank 7 beers today. Bad appetite.  Syncope likely orthostatic based upon history described (poor intake, excessive ETOH, elevated Cr). Doubt arrhythmic considering normal EF and no obstructive lesions. With fluids, his BP has risen significantly. Restart BP meds (need assistance from pharmacy to confirm med list)  Unsure if Hct is correct. Repeat pending. No history of blood loss. Normocytic.  Continue aspirin, statin.  Educated pt and wife regarding excessive etoh use. Thiamine, folate and CIWA in place though denies history of withdrawal.  Tobacco use- pt is contemplative regarding smoking cessation.  Full code. Lovenox prophylaxis.   Addendum: Repeat CBC indications normocytic anemia. No known blood loss. Will check anemia labs of B12, folate, ferritin. Consider occult GI blood loss vs. ETOH nutritional anemia. Hold on transfusion at this  time.  Signed, Elias Else, Maliq Pilley C. MD 07/22/2014, 12:10 AM

## 2014-07-22 NOTE — Progress Notes (Signed)
EKG CRITICAL VALUE     12 lead EKG performed.  Critical value noted. Edmon Crape, RN notified.   Tyrece Vanterpool H, CCT 07/22/2014 7:09 AM

## 2014-07-24 LAB — FOLATE RBC: RBC Folate: 1247 ng/mL — ABNORMAL HIGH (ref >280)

## 2014-07-25 MED FILL — Sodium Chloride IV Soln 0.9%: INTRAVENOUS | Qty: 50 | Status: AC

## 2014-08-30 ENCOUNTER — Emergency Department (INDEPENDENT_AMBULATORY_CARE_PROVIDER_SITE_OTHER)
Admission: EM | Admit: 2014-08-30 | Discharge: 2014-08-30 | Disposition: A | Discharge status: Home / Self Care | Payer: Non-veteran care | Source: Home / Self Care | Attending: Family Medicine | Admitting: Family Medicine

## 2014-08-30 ENCOUNTER — Encounter (HOSPITAL_COMMUNITY): Payer: Self-pay | Admitting: Emergency Medicine

## 2014-08-30 DIAGNOSIS — J40 Bronchitis, not specified as acute or chronic: Secondary | ICD-10-CM

## 2014-08-30 MED ORDER — ALBUTEROL SULFATE HFA 108 (90 BASE) MCG/ACT IN AERS: 2.0000 | INHALATION_SPRAY | Freq: Four times a day (QID) | RESPIRATORY_TRACT | Status: AC | PRN

## 2014-08-30 MED ORDER — PREDNISONE 10 MG PO TABS: 30.0000 mg | ORAL_TABLET | Freq: Every day | ORAL | Status: AC

## 2014-08-30 MED ORDER — AZITHROMYCIN 250 MG PO TABS: 250.0000 mg | ORAL_TABLET | Freq: Every day | ORAL | Status: AC

## 2014-08-30 NOTE — ED Notes (Signed)
Cough for 3 weeks, chest soreness with cough.  Minimal sputum production.  Low grade fever per family member.

## 2014-08-30 NOTE — Discharge Instructions (Signed)
Thank you for coming in today. Call or go to the emergency room if you get worse, have trouble breathing, have chest pains, or palpitations.  Restart your medicine.  Quit smoking.   Acute Bronchitis Bronchitis is inflammation of the airways that extend from the windpipe into the lungs (bronchi). The inflammation often causes mucus to develop. This leads to a cough, which is the most common symptom of bronchitis.  In acute bronchitis, the condition usually develops suddenly and goes away over time, usually in a couple weeks. Smoking, allergies, and asthma can make bronchitis worse. Repeated episodes of bronchitis may cause further lung problems.  CAUSES Acute bronchitis is most often caused by the same virus that causes a cold. The virus can spread from person to person (contagious) through coughing, sneezing, and touching contaminated objects. SIGNS AND SYMPTOMS   Cough.   Fever.   Coughing up mucus.   Body aches.   Chest congestion.   Chills.   Shortness of breath.   Sore throat.  DIAGNOSIS  Acute bronchitis is usually diagnosed through a physical exam. Your health care provider will also ask you questions about your medical history. Tests, such as chest X-rays, are sometimes done to rule out other conditions.  TREATMENT  Acute bronchitis usually goes away in a couple weeks. Oftentimes, no medical treatment is necessary. Medicines are sometimes given for relief of fever or cough. Antibiotic medicines are usually not needed but may be prescribed in certain situations. In some cases, an inhaler may be recommended to help reduce shortness of breath and control the cough. A cool mist vaporizer may also be used to help thin bronchial secretions and make it easier to clear the chest.  HOME CARE INSTRUCTIONS  Get plenty of rest.   Drink enough fluids to keep your urine clear or pale yellow (unless you have a medical condition that requires fluid restriction). Increasing fluids  may help thin your respiratory secretions (sputum) and reduce chest congestion, and it will prevent dehydration.   Take medicines only as directed by your health care provider.  If you were prescribed an antibiotic medicine, finish it all even if you start to feel better.  Avoid smoking and secondhand smoke. Exposure to cigarette smoke or irritating chemicals will make bronchitis worse. If you are a smoker, consider using nicotine gum or skin patches to help control withdrawal symptoms. Quitting smoking will help your lungs heal faster.   Reduce the chances of another bout of acute bronchitis by washing your hands frequently, avoiding people with cold symptoms, and trying not to touch your hands to your mouth, nose, or eyes.   Keep all follow-up visits as directed by your health care provider.  SEEK MEDICAL CARE IF: Your symptoms do not improve after 1 week of treatment.  SEEK IMMEDIATE MEDICAL CARE IF:  You develop an increased fever or chills.   You have chest pain.   You have severe shortness of breath.  You have bloody sputum.   You develop dehydration.  You faint or repeatedly feel like you are going to pass out.  You develop repeated vomiting.  You develop a severe headache. MAKE SURE YOU:   Understand these instructions.  Will watch your condition.  Will get help right away if you are not doing well or get worse. Document Released: 10/31/2004 Document Revised: 02/07/2014 Document Reviewed: 03/16/2013 [facility] Patient Information 2015 [place], [place]. This information is not intended to replace advice given to you by your health care provider. Make sure you  discuss any questions you have with your health care provider. ° °

## 2014-08-30 NOTE — ED Provider Notes (Signed)
Jeffrey Peterson is a 58 y.o. male who presents to Urgent Care today for cough. Patient is a three-day history of cough congestion wheezing and shortness of breath. The cough is somewhat productive. He's had one or 2 episodes of posttussive emesis. He feels well otherwise. He is a current smoker. He is not currently wheezing or short of breath. Patient recently stopped taking his chronic medications.   Past Medical History  Diagnosis Date  . Hypertension   . CAD (coronary artery disease)     5 stents placed  . GERD (gastroesophageal reflux disease)   . Depression   . Anxiety   . Cancer     prostate ca, s/p radical prostatectomy  . Tobacco abuse   . ETOH abuse    Past Surgical History  Procedure Laterality Date  . Coronary stent placement      five stents placed 2010  . Coronary angioplasty    . Prostatectomy     History  Substance Use Topics  . Smoking status: Current Every Day Smoker -- 1.00 packs/day for 15 years    Types: Cigarettes  . Smokeless tobacco: Not on file  . Alcohol Use: 3.6 oz/week    6 Cans of beer per week     Comment: 6 packs/day some days   ROS as above Medications: No current facility-administered medications for this encounter.   Current Outpatient Prescriptions  Medication Sig Dispense Refill  . amLODipine (NORVASC) 10 MG tablet Take 1 tablet (10 mg total) by mouth daily.    Marland Kitchen aspirin EC 81 MG EC tablet Take 1 tablet (81 mg total) by mouth daily.    Marland Kitchen atorvastatin (LIPITOR) 80 MG tablet Take 1 tablet (80 mg total) by mouth daily at 6 PM.    . lisinopril (PRINIVIL,ZESTRIL) 10 MG tablet Take 1 tablet (10 mg total) by mouth daily.    . metoprolol tartrate (LOPRESSOR) 25 MG tablet Take 1 tablet (25 mg total) by mouth 2 (two) times daily.    . Multiple Vitamin (MULTIVITAMIN WITH MINERALS) TABS tablet Take 1 tablet by mouth daily.    . nitroGLYCERIN (NITROSTAT) 0.4 MG SL tablet Place 1 tablet (0.4 mg total) under the tongue every 5 (five) minutes x 3 doses as  needed for chest pain.  12  . sertraline (ZOLOFT) 50 MG tablet Take 1 tablet (50 mg total) by mouth daily.    Marland Kitchen albuterol (PROVENTIL HFA;VENTOLIN HFA) 108 (90 BASE) MCG/ACT inhaler Inhale 2 puffs into the lungs every 6 (six) hours as needed for wheezing or shortness of breath. 1 Inhaler 2  . azithromycin (ZITHROMAX) 250 MG tablet Take 1 tablet (250 mg total) by mouth daily. Take first 2 tablets together, then 1 every day until finished. 6 tablet 0  . predniSONE (DELTASONE) 10 MG tablet Take 3 tablets (30 mg total) by mouth daily. 15 tablet 0   No Known Allergies   Exam:  BP 147/84 mmHg  Pulse 90  Temp(Src) 98 F (36.7 C) (Oral)  Resp 16  SpO2 100% Gen: Well NAD HEENT: EOMI,  MMM normal posterior pharynx and tympanic membranes Lungs: Normal work of breathing. Coarse breath sounds throughout Heart: RRR no MRG Abd: NABS, Soft. Nondistended, Nontender Exts: Brisk capillary refill, warm and well perfused.   No results found for this or any previous visit (from the past 24 hour(s)). No results found.  Assessment and Plan: 58 y.o. male with bronchitis likely related to smoking. Treatment with prednisone and azithromycin and albuterol. Recommend quit smoking. Recommend restart  chronic medications.  Discussed warning signs or symptoms. Please see discharge instructions. Patient expresses understanding.     Gregor Hams, MD 08/30/14 [phone]

## 2014-09-15 ENCOUNTER — Encounter (HOSPITAL_COMMUNITY): Payer: Self-pay | Admitting: Cardiology

## 2015-12-30 IMAGING — CR DG CHEST 1V PORT
1 series · 1 of 1 positions shown · non-contrast
Comparison: None.

CLINICAL DATA: Syncope.

EXAM:
PORTABLE CHEST - 1 VIEW

[AP]
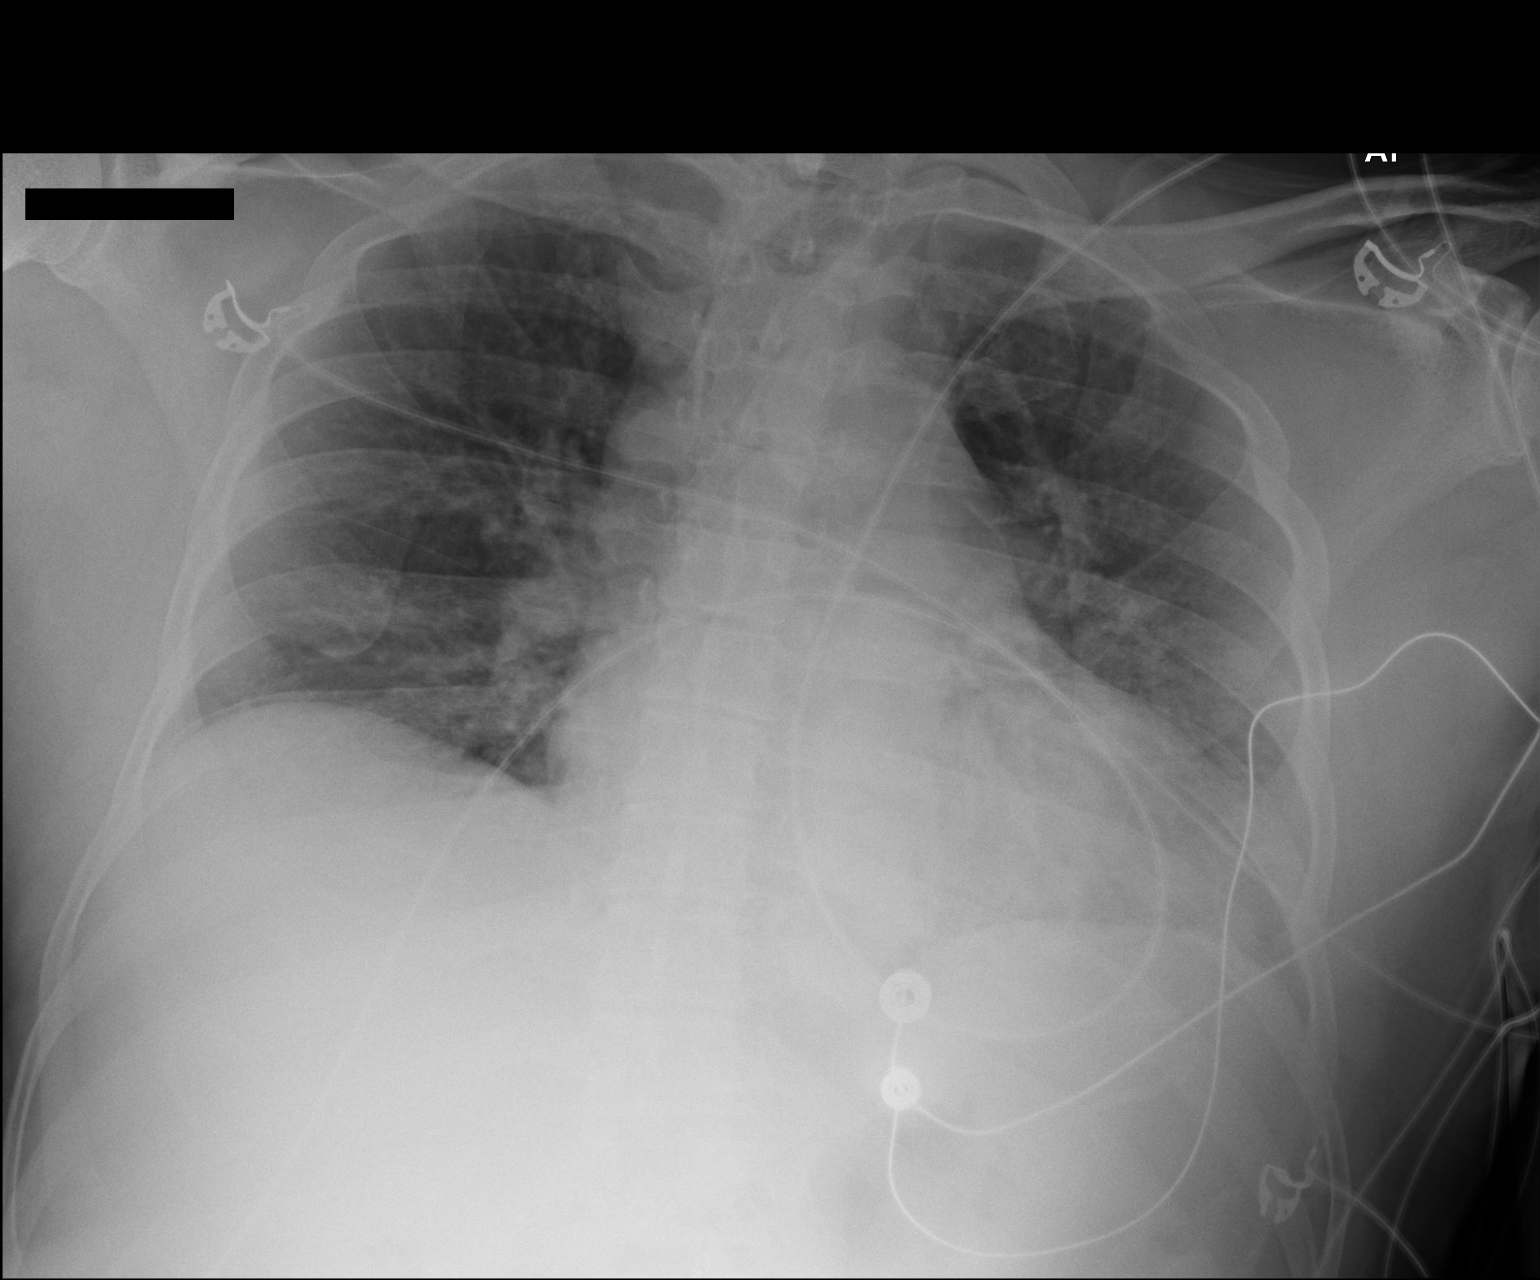

[1 of 1 positions shown; findings below may reference images not displayed]

FINDINGS: The cardiac silhouette is mildly enlarged. The lungs are
hypoinflated with mild pulmonary vascular congestion centrally. No
confluent airspace opacity, pleural effusion, or pneumothorax is
identified. No acute osseous abnormality is identified.
IMPRESSION: Hypoinflation with mild vascular congestion.

## 2019-05-15 LAB — HEMOGLOBIN A1C
Estimated Avg Glucose, External: 136 mg/dL — ABNORMAL HIGH (ref 91–123)
Hemoglobin A1C, External: 6.4 % — ABNORMAL HIGH (ref 4.8–5.6)

## 2020-11-04 LAB — HEMOGLOBIN A1C
Estimated Avg Glucose, External: 322 mg/dL — ABNORMAL HIGH (ref 91–123)
Hemoglobin A1C, External: 12.8 % — ABNORMAL HIGH (ref 4.8–5.6)

## 2020-11-05 LAB — HEMOGLOBIN A1C
Estimated Avg Glucose, External: 319 mg/dL — ABNORMAL HIGH (ref 91–123)
Hemoglobin A1C, External: 12.8 % — ABNORMAL HIGH (ref 4.8–5.6)

## 2021-01-23 LAB — HEMOGLOBIN A1C
Estimated Avg Glucose, External: 359 mg/dL — ABNORMAL HIGH (ref 91–123)
Hemoglobin A1C, External: 14.1 % — ABNORMAL HIGH (ref 4.8–5.6)

## 2021-01-24 LAB — HEMOGLOBIN A1C
Estimated Avg Glucose, External: 371 mg/dL — ABNORMAL HIGH (ref 91–123)
Hemoglobin A1C, External: 14.5 % — ABNORMAL HIGH (ref 4.8–5.6)

## 2021-01-25 LAB — HEMOGLOBIN A1C
Estimated Avg Glucose, External: 370 mg/dL — ABNORMAL HIGH (ref 91–123)
Hemoglobin A1C, External: 14.5 % — ABNORMAL HIGH (ref 4.8–5.6)

## 2021-12-12 ENCOUNTER — Inpatient Hospital Stay: Admit: 2021-12-12 | Payer: PRIVATE HEALTH INSURANCE

## 2021-12-12 ENCOUNTER — Ambulatory Visit: Admit: 2021-12-12 | Discharge: 2021-12-12 | Payer: PRIVATE HEALTH INSURANCE | Attending: Internal Medicine

## 2021-12-12 DIAGNOSIS — D464 Refractory anemia, unspecified: Secondary | ICD-10-CM

## 2021-12-12 LAB — COMPREHENSIVE METABOLIC PANEL
ALT: 26 U/L (ref 16–61)
AST: 19 U/L (ref 10–38)
Albumin/Globulin Ratio: 0.8 (ref 0.8–1.7)
Albumin: 3.6 g/dL (ref 3.4–5.0)
Alk Phosphatase: 87 U/L (ref 45–117)
Anion Gap: 2 mmol/L — ABNORMAL LOW (ref 3.0–18)
BUN: 20 MG/DL — ABNORMAL HIGH (ref 7.0–18)
Bun/Cre Ratio: 13 (ref 12–20)
CO2: 25 mmol/L (ref 21–32)
Calcium: 9.2 MG/DL (ref 8.5–10.1)
Chloride: 112 mmol/L — ABNORMAL HIGH (ref 100–111)
Creatinine: 1.59 MG/DL — ABNORMAL HIGH (ref 0.6–1.3)
Est, Glom Filt Rate: 47 mL/min/{1.73_m2} — ABNORMAL LOW (ref >60)
Globulin: 4.8 g/dL — ABNORMAL HIGH (ref 2.0–4.0)
Glucose: 95 mg/dL (ref 74–99)
Potassium: 5.1 mmol/L (ref 3.5–5.5)
Sodium: 139 mmol/L (ref 136–145)
Total Bilirubin: 0.3 MG/DL (ref 0.2–1.0)
Total Protein: 8.4 g/dL — ABNORMAL HIGH (ref 6.4–8.2)

## 2021-12-12 LAB — CBC WITH AUTO DIFFERENTIAL
Absolute Eos #: 0.2 10*3/uL (ref 0.0–0.4)
Absolute Immature Granulocyte: 0 10*3/uL (ref 0.00–0.04)
Absolute Lymph #: 2.4 10*3/uL (ref 0.9–3.6)
Absolute Mono #: 0.7 10*3/uL (ref 0.05–1.2)
Basophils Absolute: 0 10*3/uL (ref 0.0–0.1)
Basophils: 1 % (ref 0–2)
Eosinophils %: 4 % (ref 0–5)
Hematocrit: 32.8 % — ABNORMAL LOW (ref 36.0–48.0)
Hemoglobin: 10.1 g/dL — ABNORMAL LOW (ref 13.0–16.0)
Immature Granulocytes: 0 % (ref 0.0–0.5)
Lymphocytes: 42 % (ref 21–52)
MCH: 26.5 PG (ref 24.0–34.0)
MCHC: 30.8 g/dL — ABNORMAL LOW (ref 31.0–37.0)
MCV: 86.1 FL (ref 78.0–100.0)
MPV: 10.2 FL (ref 9.2–11.8)
Monocytes: 12 % — ABNORMAL HIGH (ref 3–10)
Nucleated RBCs: 0 PER 100 WBC
Platelets: 276 10*3/uL (ref 135–420)
RBC: 3.81 M/uL — ABNORMAL LOW (ref 4.35–5.65)
RDW: 17.7 % — ABNORMAL HIGH (ref 11.6–14.5)
Seg Neutrophils: 42 % (ref 40–73)
Segs Absolute: 2.5 10*3/uL (ref 1.8–8.0)
WBC: 5.9 10*3/uL (ref 4.6–13.2)
nRBC: 0 10*3/uL (ref 0.00–0.01)

## 2021-12-12 LAB — IRON AND TIBC
Iron % Saturation: 17 % — ABNORMAL LOW (ref 20–50)
Iron: 71 ug/dL (ref 50–175)
TIBC: 419 ug/dL (ref 250–450)

## 2021-12-12 NOTE — Progress Notes (Signed)
Hematology/Oncology Consultation Note      Date: 12/12/2021    Name: Bradley Jackson  DOB: 04-Jun-1956     No primary care provider on file.      Subjective:     Chief complaint: Multiple myeloma       History of Present Illness:   Bradley Jackson is a most pleasant 66 y.o. year old male who was seen for consultation of Multiple myeloma.   Bradley Jackson has a past medical history significant of Multiple myeloma.  There're no available medical chart for review at this time.   Upon further history taking, Bradley Jackson reported Bradley Jackson was diagnosed monoclonal gammopathy since 2013.  Bradley Jackson was diagnosed Multiple myeloma in 2018 after BMBx reported 60% plasma cells.   Bradley Jackson was started chemotherapy with VRD. During therapy courses, Bradley Jackson was admitted d/t uncontrolled DM from dexamethasone.   Bradley Jackson reportedly stopped MM therapy (?) in 2021.  Bradley Jackson denied Hx BMT.  Bradley Jackson reported a repeat BMBx in 05/2021 which reported 5% plasma cell.  Bradley Jackson denied any active multiple myeloma since then.  Bradley Jackson reported Bradley Jackson has fu PCP for refractory anemia which was not improved effectively by Procrit/Retacrit.  Bradley Jackson reported chronic fatigue.   Bradley Jackson otherwise has no other complaints. Denied fever, chills, night sweat, unintentional weight loss, skin lumps or bumps, acute bleeding or bruising issues. Denied headache, acute vision change, dizziness, chest pain, worsen shortness of breath, palpitation, productive cough, nausea, vomiting, abdominal pain, altered bowel habits, dysuria, worsen bone pain or back pain, new focal numbness or weakness.         Past Medical History, Family History, and Social History:    History reviewed. No pertinent past medical history.  History reviewed. No pertinent surgical history.      History reviewed. No pertinent family history.  Current Outpatient Medications   Medication Sig Dispense Refill    atorvastatin (LIPITOR) 80 MG tablet TAKE ONE TABLET BY MOUTH EVERY NIGHT AT BEDTIME -FOR CHOLESTEROL      carvedilol (COREG) 25  MG tablet TAKE ONE TABLET BY MOUTH TWICE A DAY      allopurinol (ZYLOPRIM) 100 MG tablet TAKE ONE TABLET BY MOUTH EVERY DAY FOR GOUT      amLODIPine (NORVASC) 10 MG tablet TAKE ONE TABLET BY MOUTH EVERY DAY FOR BLOOD PRESSURE **TAKE WITH EVENING DOSE OF CARVEDILOL**      aspirin 81 MG EC tablet TAKE ONE TABLET BY MOUTH EVERY DAY      vitamin D (CHOLECALCIFEROL) 25 MCG (1000 UT) TABS tablet TAKE TWO TABLETS BY MOUTH EVERY DAY      metFORMIN (GLUCOPHAGE) 1000 MG tablet TAKE ONE TABLET BY MOUTH TWICE A DAY FOR DIABETES      sildenafil (VIAGRA) 100 MG tablet TAKE ONE-HALF TABLET BY MOUTH AS DIRECTED FOR ERECTILE DYSFUNCTION 30 MINUTES TO 4 HOURS PRIOR TO SEXUAL ACTIVITY. NOT TO EXCEED ONE DOSE/24 HOURS*CANNOT BE REPLACED IF LOST*       No current facility-administered medications for this visit.       Review of Systems   Constitutional:  Positive for fatigue. Negative for appetite change and fever.   Respiratory:  Negative for cough and shortness of breath.    Cardiovascular:  Negative for chest pain.   Gastrointestinal:  Negative for abdominal pain, diarrhea, nausea and vomiting.   Genitourinary:  Negative for flank pain.   Musculoskeletal:  Negative for back pain, joint swelling and neck pain.   Skin:  Negative for rash.   Neurological:  Negative for syncope, weakness, numbness and headaches.   Hematological:  Negative for adenopathy.   Psychiatric/Behavioral:  Negative for behavioral problems.            Objective:   BP 118/76    Pulse 76    Resp 16    Ht '5\' 10"'  (1.778 m)    Wt 228 lb 12.8 oz (103.8 kg)    SpO2 97%    BMI 32.83 kg/m??     ECOG Performance Status (grade): 0  0 - able to carry on all pre-disease activity w/out restriction  1 - restricted but able to carry out light work  2 - ambulatory and can self- care but unable to carry out work  3 - bed or chair >50% of waking hours  4 - completely disable, total care, confined to bed or chair    Physical Exam  Constitutional:       Appearance: Normal appearance.  Bradley Jackson is not ill-appearing.   HENT:      Head: Normocephalic and atraumatic.   Cardiovascular:      Rate and Rhythm: Normal rate and regular rhythm.      Pulses: Normal pulses.      Heart sounds: Normal heart sounds.   Pulmonary:      Effort: Pulmonary effort is normal.      Breath sounds: Normal breath sounds.   Abdominal:      General: Bowel sounds are normal.      Palpations: Abdomen is soft.      Tenderness: There is no abdominal tenderness. There is no guarding.   Musculoskeletal:         General: No swelling or tenderness.      Cervical back: Neck supple.   Skin:     General: Skin is warm.      Findings: No erythema or rash.   Neurological:      General: No focal deficit present.      Mental Status: Bradley Jackson is alert and oriented to person, place, and time. Mental status is at baseline.   Psychiatric:         Behavior: Behavior normal.        Diagnostics:      No results found for this or any previous visit (from Bradley past 96 hour(s)).    Imaging:  No valid procedures specified.    No results found for this or any previous visit.    No results found for this or any previous visit.          Diagnosis:                                        1. Multiple myeloma not having achieved remission ([facility])    2. Refractory anemia ([facility])        Assessment and Plan:                                        # Multiple Myeloma  # Refractory Anemia    -- Past medical history significant of Multiple myeloma.  -- Bradley Jackson reported Bradley Jackson was diagnosed monoclonal gammopathy since 2013.  -- Bradley Jackson was diagnosed Multiple myeloma in 2018 after BMBx reported 60% plasma cells.   -- Bradley Jackson was started chemotherapy with VRD. During therapy courses, Bradley Jackson was admitted  d/t uncontrolled DM from dexamethasone.   -- Bradley Jackson reportedly stopped MM therapy (?) in 2021. Denied Hx BMT.  -- Bradley Jackson reported a repeat BMBx in 05/2021 which reported 5% plasma cell.  -- Bradley Jackson denied any active multiple myeloma since then.  -- Bradley Jackson reported Bradley Jackson has fu PCP for refractory anemia  which was not improved effectively by Procrit/Retacrit.    Plan:  -- Multiple myeloma work-up: CBC with differential, chemistry, SPEP/IFE/FLC, beta-2 microglobulin, LDH, UPEP/IFE, and skeletal survey.  -- Refractory anemia work-up with iron profile/ferritin, LDH/haptoglobin, vitamin B12/MMA/folate.  --We will try to obtain previous medical record for chart review.  -- Bradley Jackson will be notified if any interventions is warranted. Further workup will be guided by results from aforementioned initial study.   -- All Bradley Jackson's questions answered, Bradley Jackson was agreeable with Bradley plan.  -- We will see Bradley Jackson back in about 3-4 weeks for report review. Always sooner if required.        Orders Placed This Encounter   Procedures    XR BONE SURVEY COMPLETE     Standing Status:   Future     Standing Expiration Date:   12/13/2022    Gammopathy Eval, SPEP/IFE, IG Qt/FLC     Standing Status:   Future     Number of Occurrences:   1     Standing Expiration Date:   12/13/2022    Haptoglobin    Lactate Dehydrogenase     Standing Status:   Future     Number of Occurrences:   1     Standing Expiration Date:   12/13/2022    Methylmalonic Acid, Serum     Standing Status:   Future     Number of Occurrences:   1     Standing Expiration Date:   12/13/2022    Transferrin Saturation     Standing Status:   Future     Standing Expiration Date:   12/13/2022    Comprehensive Metabolic Panel     Standing Status:   Future     Number of Occurrences:   1     Standing Expiration Date:   12/13/2022    CBC with Auto Differential     Standing Status:   Future     Number of Occurrences:   1     Standing Expiration Date:   12/13/2022    Beta 2 Microglobulin, Serum    IFE+Protein Electrophoresis, 24 Hour Urine     Standing Status:   Future     Standing Expiration Date:   12/13/2022    Free Kappa Lambda Light Chains, Urine, Quant     Standing Status:   Future     Standing Expiration Date:   12/13/2022           Bradley Jackson has a reminder for a "due or due soon"  health maintenance. I have asked that Bradley Jackson contact his primary care provider for follow-up on this health maintenance.   All of Bradley Jackson's questions answered to their apparent satisfaction. They verbally show understanding and agreement with aforementioned plan.         Bonnielee Haff Ledia Hanford, MD  12/12/2021        Above mentioned total time spent for this encounter with more than 50% of Bradley time spent in face-to-face counseling, discussing on diagnosis and management plan going forward, and co-ordination of care.  Parts of this document has been produced using Dragon dictation system.  Unrecognized errors in  transcription may be present. Please Ariany Kesselman not hesitate to reach out for any questions or clarifications.      CC: No primary care provider on file.,

## 2021-12-13 LAB — VITAMIN B12 & FOLATE
Folate: >20 ng/mL — ABNORMAL HIGH (ref 3.10–17.50)
Vitamin B-12: 393 pg/mL (ref 211–911)

## 2021-12-13 LAB — LACTATE DEHYDROGENASE: LD: 159 U/L (ref 81–234)

## 2021-12-15 LAB — METHYLMALONIC ACID, SERUM: Methylmalonic Acid: 143 nmol/L (ref 0–378)

## 2021-12-17 LAB — GAMMOPATHY EVAL, SPEP/IFE, IG QT/FLC
A/G Ratio: 0.9 (ref 0.7–1.7)
Albumin: 3.5 g/dL (ref 2.9–4.4)
Alpha 1 Globulin: 0.2 g/dL (ref 0.0–0.4)
Alpha 2 Globulin: 1 g/dL (ref 0.4–1.0)
BETA GLOBULIN: 1.1 g/dL (ref 0.7–1.3)
Free Kappa Light Chains: 30.4 mg/L — ABNORMAL HIGH (ref 3.3–19.4)
Free Lambda Light Chains: 262.1 mg/L — ABNORMAL HIGH (ref 5.7–26.3)
GAMMA GLOBULIN: 2 g/dL — ABNORMAL HIGH (ref 0.4–1.8)
Globulin: 4.3 g/dL — ABNORMAL HIGH (ref 2.2–3.9)
IgA: 154 mg/dL (ref 61–437)
IgG, Serum: 2329 mg/dL — ABNORMAL HIGH (ref 603–1613)
IgM: 60 mg/dL (ref 20–172)
K/L Ratio: 0.12 — ABNORMAL LOW (ref 0.26–1.65)
M-Spike: 1.5 g/dL — ABNORMAL HIGH
Total Protein: 7.8 g/dL (ref 6.0–8.5)

## 2021-12-18 ENCOUNTER — Inpatient Hospital Stay: Admit: 2021-12-18 | Payer: PRIVATE HEALTH INSURANCE

## 2021-12-18 DIAGNOSIS — C9000 Multiple myeloma not having achieved remission: Secondary | ICD-10-CM

## 2021-12-19 LAB — FREE KAPPA LAMBDA LIGHT CHAINS, URINE, QUANT
Free Kappa Lt Chains,Ur: 45.39 mg/L (ref 1.17–86.46)
Free Lambda Lt Chains,Ur: 79.59 mg/L — ABNORMAL HIGH (ref 0.27–15.21)
Kappa/Lambda Ratio,U: 0.57 — ABNORMAL LOW (ref 1.83–14.26)

## 2021-12-20 ENCOUNTER — Ambulatory Visit: Attending: Internal Medicine

## 2021-12-20 ENCOUNTER — Encounter: Attending: Internal Medicine

## 2021-12-20 LAB — IFE+PROTEIN ELECTROPHORESIS, 24 HOUR URINE
Albumin, 24 Hr Urine: 41.6 %
Alpha-1-Globulin, U: 5.1 %
Alpha-2-Globulin, U: 6.9 %
Beta Globulin, U: 33.3 %
Gamma Globulin, U: 13.1 %
M Spike 24hr Urine Protein Elec: 45 mg/24 hr — ABNORMAL HIGH
M-Spike, %: 19.2 % — ABNORMAL HIGH
Protein, 24H Urine: 235 mg/24 hr — ABNORMAL HIGH (ref 30–150)
Protein, Total Urine: 9.8 mg/dL

## 2021-12-24 NOTE — Telephone Encounter (Signed)
ERROR

## 2021-12-24 NOTE — Telephone Encounter (Signed)
Voicemail left for results and rx

## 2021-12-24 NOTE — Telephone Encounter (Signed)
PT stated his anemia is getting worse and  would like to know what he can do until his appt in April. Also requesting Rx for Iron Pill. Pt requesting return call.

## 2021-12-27 NOTE — Telephone Encounter (Signed)
Patient called and would like to be contacted to discuss what he needs to do to get his Anemia treated. Please advise

## 2022-01-08 ENCOUNTER — Ambulatory Visit: Payer: PRIVATE HEALTH INSURANCE | Primary: Internal Medicine

## 2022-01-08 ENCOUNTER — Inpatient Hospital Stay: Admit: 2022-01-08 | Payer: PRIVATE HEALTH INSURANCE | Primary: Internal Medicine

## 2022-01-08 DIAGNOSIS — C9000 Multiple myeloma not having achieved remission: Secondary | ICD-10-CM

## 2022-01-11 ENCOUNTER — Ambulatory Visit
Admit: 2022-01-11 | Discharge: 2022-01-11 | Payer: PRIVATE HEALTH INSURANCE | Attending: Internal Medicine | Primary: Internal Medicine

## 2022-01-11 DIAGNOSIS — C9000 Multiple myeloma not having achieved remission: Secondary | ICD-10-CM

## 2022-01-11 NOTE — Progress Notes (Signed)
Hematology/Oncology Note      Date: 01/11/2022    Name: [facility]  DOB: Aug 10, 1956     No primary care provider on file.      Subjective:     Chief complaint: Multiple myeloma       History of Present Illness:   Mr. Bradley Jackson is a most pleasant 66 y.o. year old male who was seen for consultation of Multiple myeloma.   The patient has a past medical history significant of Multiple myeloma.  Upon further history taking, patient reported he was diagnosed monoclonal gammopathy since 2013.  He was diagnosed Multiple myeloma in 2018 after BMBx reported 60% plasma cells.   He was started chemotherapy with VRD. During therapy courses, the patient was admitted d/t uncontrolled DM from dexamethasone.   He reportedly stopped MM therapy (?) in 2021.  He denied Hx BMT.  He reported a repeat BMBx in 05/2021 which reported 5% plasma cell.  He denied any active multiple myeloma since then.  The patient reported he has fu PCP for refractory anemia which was not improved effectively by Procrit/Retacrit.  The patient reported chronic fatigue.   The patient otherwise has no other complaints. Denied fever, chills, night sweat, unintentional weight loss, skin lumps or bumps, acute bleeding or bruising issues. Denied headache, acute vision change, dizziness, chest pain, worsen shortness of breath, palpitation, productive cough, nausea, vomiting, abdominal pain, altered bowel habits, dysuria, worsen bone pain or back pain, new focal numbness or weakness.         Past Medical History, Family History, and Social History:    No past medical history on file.  No past surgical history on file.      No family history on file.  Current Outpatient Medications   Medication Sig Dispense Refill    atorvastatin (LIPITOR) 80 MG tablet TAKE ONE TABLET BY MOUTH EVERY NIGHT AT BEDTIME -FOR CHOLESTEROL      carvedilol (COREG) 25 MG tablet TAKE ONE TABLET BY MOUTH TWICE A DAY      allopurinol (ZYLOPRIM) 100 MG tablet TAKE ONE TABLET BY MOUTH EVERY DAY FOR GOUT       amLODIPine (NORVASC) 10 MG tablet TAKE ONE TABLET BY MOUTH EVERY DAY FOR BLOOD PRESSURE **TAKE WITH EVENING DOSE OF CARVEDILOL**      aspirin 81 MG EC tablet TAKE ONE TABLET BY MOUTH EVERY DAY      vitamin D (CHOLECALCIFEROL) 25 MCG (1000 UT) TABS tablet TAKE TWO TABLETS BY MOUTH EVERY DAY      metFORMIN (GLUCOPHAGE) 1000 MG tablet TAKE ONE TABLET BY MOUTH TWICE A DAY FOR DIABETES      sildenafil (VIAGRA) 100 MG tablet TAKE ONE-HALF TABLET BY MOUTH AS DIRECTED FOR ERECTILE DYSFUNCTION 30 MINUTES TO 4 HOURS PRIOR TO SEXUAL ACTIVITY. NOT TO EXCEED ONE DOSE/24 HOURS*CANNOT BE REPLACED IF LOST*       No current facility-administered medications for this visit.       Review of Systems   Constitutional:  Positive for fatigue. Negative for appetite change and fever.   Respiratory:  Negative for cough and shortness of breath.    Cardiovascular:  Negative for chest pain.   Gastrointestinal:  Negative for abdominal pain, diarrhea, nausea and vomiting.   Genitourinary:  Negative for flank pain.   Musculoskeletal:  Negative for back pain, joint swelling and neck pain.   Skin:  Negative for rash.   Neurological:  Negative for syncope, weakness, numbness and headaches.   Hematological:  Negative for adenopathy.  Psychiatric/Behavioral:  Negative for behavioral problems.            Objective:   BP 138/81   Pulse 62   Resp 18   Ht '5\' 10"'  (1.778 m)   Wt 226 lb (102.5 kg)   SpO2 97%   BMI 32.43 kg/m     ECOG Performance Status (grade): 0  0 - able to carry on all pre-disease activity w/out restriction  1 - restricted but able to carry out light work  2 - ambulatory and can self- care but unable to carry out work  3 - bed or chair >50% of waking hours  4 - completely disable, total care, confined to bed or chair    Physical Exam  Constitutional:       Appearance: Normal appearance. He is not ill-appearing.   HENT:      Head: Normocephalic and atraumatic.   Cardiovascular:      Rate and Rhythm: Normal rate and regular  rhythm.      Pulses: Normal pulses.      Heart sounds: Normal heart sounds.   Pulmonary:      Effort: Pulmonary effort is normal.      Breath sounds: Normal breath sounds.   Abdominal:      General: Bowel sounds are normal.      Palpations: Abdomen is soft.      Tenderness: There is no abdominal tenderness. There is no guarding.   Musculoskeletal:         General: No swelling or tenderness.      Cervical back: Neck supple.   Skin:     General: Skin is warm.      Findings: No erythema or rash.   Neurological:      General: No focal deficit present.      Mental Status: He is alert and oriented to person, place, and time. Mental status is at baseline.   Psychiatric:         Behavior: Behavior normal.        Diagnostics:      No results found for this or any previous visit (from the past 96 hour(s)).    Imaging:  No valid procedures specified.    No results found for this or any previous visit.    No results found for this or any previous visit.          Diagnosis:                                        1. Multiple myeloma not having achieved remission ([place])    2. Refractory anemia ([facility])        Assessment and Plan:                                        # Multiple Myeloma  # Refractory Anemia    -- Past medical history significant of Multiple myeloma.  -- Patient reported he was diagnosed monoclonal gammopathy since 2013.  -- He was diagnosed Multiple myeloma in 2018 after BMBx reported 60% plasma cells.   -- He was started chemotherapy with VRD. During therapy courses, the patient was admitted d/t uncontrolled DM from dexamethasone.   -- He reportedly stopped MM therapy (?) in 2021. Denied Hx BMT.  -- He reported a  repeat BMBx in 05/2021 which reported 5% plasma cell.  -- He denied any active multiple myeloma since then.  -- The patient reported he has fu PCP for refractory anemia which was not improved effectively by Procrit/Retacrit.  -- Today I have reviewed with the patient about recent lab reports.   12/12/2021 CBC  reported hemoglobin 10.1, hematocrit 32.8%, WBC 5.9, platelet 276,000.  BUN 20, creatinine 1.59  Iron saturation 17%, TIBC 419, folate> 20, B12 393.  MMA 143.  LDH 159  Serum protein electrophoresis reported M spike 1.5, immunofixation shows IgG monoclonal protein with lambda light chain specificity, IgG 2329.  Serum free kappa/lambda ratio 0.57.  Serum free lambda light chain 262, kappa/lambda ratio 0.12.  Urine protein electrophoresis reported M spike 19.2%, an additional M spike was observed at a concentration of 3.2 %.  Urine kappa/lambda ratio 0.57.  01/08/2022 skeletal survey reported No lytic lesions are seen to suggest the presence of multiple  myeloma. Prominent degenerative changes are seen involving the spine.    Plan:  -- The patient will bring previous records for chart review  -- BMBx given concerned relapsed MM  -- [facility] Referral per requested.   -- All patient's questions answered, the patient was agreeable with the plan.  -- We will see the patient back in about 3-4 weeks for report review. Always sooner if required.        Orders Placed This Encounter   Procedures    IR BIOPSY BONE MARROW     Standing Status:   Future     Standing Expiration Date:   01/12/2023    External Referral To Oncology     Referral Priority:   Routine     Referral Type:   Eval and Treat     Referral Reason:   Specialty Services Required     Requested Specialty:   Transplant Surgery     Number of Visits Requested:   1           Mr. Siyon Keenum has a reminder for a "due or due soon" health maintenance. I have asked that he contact his primary care provider for follow-up on this health maintenance.   All of patient's questions answered to their apparent satisfaction. They verbally show understanding and agreement with aforementioned plan.         Bonnielee Haff Lavaughn Bisig, MD  01/11/2022        Above mentioned total time spent for this encounter with more than 50% of the time spent in face-to-face counseling, discussing on diagnosis and  management plan going forward, and co-ordination of care.  Parts of this document has been produced using Dragon dictation system.  Unrecognized errors in transcription may be present. Please Korene Dula not hesitate to reach out for any questions or clarifications.      CC: No primary care provider on file.,

## 2022-01-14 NOTE — Telephone Encounter (Signed)
Spoke with pt. He states he started taking oral iron OTC approx. 1-2 weeks ago, and is taking it every other day.  He has follow up appt in our office on 02/08/22 with lab appt on 01/24/2022. Advised pt. To continue oral iron every other day and will discuss further at next office visit.

## 2022-01-15 NOTE — Telephone Encounter (Signed)
Patient called & advised he is approved to have his BX @ [facility] per the [facility]

## 2022-01-22 ENCOUNTER — Encounter: Payer: PRIVATE HEALTH INSURANCE | Attending: Internal Medicine | Primary: Internal Medicine

## 2022-01-24 ENCOUNTER — Ambulatory Visit: Admit: 2022-01-24 | Discharge: 2022-01-24 | Payer: PRIVATE HEALTH INSURANCE | Primary: Internal Medicine

## 2022-01-24 ENCOUNTER — Inpatient Hospital Stay: Admit: 2022-01-24 | Payer: PRIVATE HEALTH INSURANCE | Primary: Internal Medicine

## 2022-01-24 DIAGNOSIS — C9000 Multiple myeloma not having achieved remission: Secondary | ICD-10-CM

## 2022-01-25 LAB — HAPTOGLOBIN: Haptoglobin: 273 mg/dL — ABNORMAL HIGH (ref 30–200)

## 2022-01-25 LAB — TRANSFERRIN: Transferrin: 292 mg/dL (ref 177–329)

## 2022-01-25 LAB — BETA 2 MICROGLOBULIN, SERUM: Beta-2 Microglobulin: 2.1 mg/L (ref 0.6–2.4)

## 2022-02-01 ENCOUNTER — Inpatient Hospital Stay: Payer: PRIVATE HEALTH INSURANCE | Primary: Internal Medicine

## 2022-02-04 ENCOUNTER — Encounter

## 2022-02-04 ENCOUNTER — Ambulatory Visit: Payer: PRIVATE HEALTH INSURANCE | Primary: Internal Medicine

## 2022-02-04 ENCOUNTER — Inpatient Hospital Stay: Admit: 2022-02-04 | Payer: PRIVATE HEALTH INSURANCE | Attending: Diagnostic Radiology | Primary: Internal Medicine

## 2022-02-04 DIAGNOSIS — C9000 Multiple myeloma not having achieved remission: Secondary | ICD-10-CM

## 2022-02-04 LAB — CBC WITH AUTO DIFFERENTIAL
Absolute Immature Granulocyte: 0 10*3/uL (ref 0.00–0.04)
Basophils %: 1 % (ref 0–2)
Basophils Absolute: 0 10*3/uL (ref 0.0–0.1)
Eosinophils %: 1 % (ref 0–5)
Eosinophils Absolute: 0.1 10*3/uL (ref 0.0–0.4)
Hematocrit: 35.1 % — ABNORMAL LOW (ref 36.0–48.0)
Hemoglobin: 11.2 g/dL — ABNORMAL LOW (ref 13.0–16.0)
Immature Granulocytes: 1 % — ABNORMAL HIGH (ref 0.0–0.5)
Lymphocytes %: 41 % (ref 21–52)
Lymphocytes Absolute: 3.2 10*3/uL (ref 0.9–3.6)
MCH: 27.2 PG (ref 24.0–34.0)
MCHC: 31.9 g/dL (ref 31.0–37.0)
MCV: 85.2 FL (ref 78.0–100.0)
MPV: 9.6 FL (ref 9.2–11.8)
Monocytes %: 9 % (ref 3–10)
Monocytes Absolute: 0.7 10*3/uL (ref 0.05–1.2)
Neutrophils %: 48 % (ref 40–73)
Neutrophils Absolute: 3.8 10*3/uL (ref 1.8–8.0)
Nucleated RBCs: 0 PER 100 WBC
Platelets: 242 10*3/uL (ref 135–420)
RBC: 4.12 M/uL — ABNORMAL LOW (ref 4.35–5.65)
RDW: 18.6 % — ABNORMAL HIGH (ref 11.6–14.5)
WBC: 7.9 10*3/uL (ref 4.6–13.2)
nRBC: 0 10*3/uL (ref 0.00–0.01)

## 2022-02-04 LAB — BASIC METABOLIC PANEL
Anion Gap: 2 mmol/L — ABNORMAL LOW (ref 3.0–18)
BUN: 23 MG/DL — ABNORMAL HIGH (ref 7.0–18)
Bun/Cre Ratio: 14 (ref 12–20)
CO2: 27 mmol/L (ref 21–32)
Calcium: 9.6 MG/DL (ref 8.5–10.1)
Chloride: 110 mmol/L (ref 100–111)
Creatinine: 1.69 MG/DL — ABNORMAL HIGH (ref 0.6–1.3)
Est, Glom Filt Rate: 44 mL/min/{1.73_m2} — ABNORMAL LOW (ref >60)
Glucose: 99 mg/dL (ref 74–99)
Potassium: 5.2 mmol/L (ref 3.5–5.5)
Sodium: 139 mmol/L (ref 136–145)

## 2022-02-04 LAB — PROTIME-INR
INR: 1.1 (ref 0.8–1.2)
Protime: 14.7 s (ref 11.5–15.2)

## 2022-02-04 LAB — APTT: PTT: 28.2 s (ref 23.0–36.4)

## 2022-02-04 MED ORDER — LIDOCAINE HCL (PF) 1 % IJ SOLN
1 % | INTRAMUSCULAR | Status: AC | PRN
Start: 2022-02-04 — End: 2022-02-04
  Administered 2022-02-04: 17:00:00 10 via INTRADERMAL

## 2022-02-04 MED ORDER — FENTANYL CITRATE (PF) 100 MCG/2ML IJ SOLN
100 MCG/2ML | INTRAMUSCULAR | Status: AC | PRN
Start: 2022-02-04 — End: 2022-02-04
  Administered 2022-02-04 (×2): 50 via INTRAVENOUS

## 2022-02-04 MED ORDER — FENTANYL CITRATE (PF) 100 MCG/2ML IJ SOLN
100 MCG/2ML | INTRAMUSCULAR | Status: AC
Start: 2022-02-04 — End: ?

## 2022-02-04 MED ORDER — MIDAZOLAM HCL (PF) 2 MG/2ML IJ SOLN
2 MG/ML | INTRAMUSCULAR | Status: AC | PRN
Start: 2022-02-04 — End: 2022-02-04
  Administered 2022-02-04 (×2): 1 via INTRAVENOUS

## 2022-02-04 MED ORDER — MIDAZOLAM HCL 2 MG/2ML IJ SOLN
2 MG/ML | INTRAMUSCULAR | Status: AC
Start: 2022-02-04 — End: ?

## 2022-02-04 MED ORDER — SODIUM CHLORIDE 0.9 % IV SOLN
0.9 % | INTRAVENOUS | Status: AC
Start: 2022-02-04 — End: ?

## 2022-02-04 MED ORDER — SODIUM CHLORIDE 0.9 % IV SOLN
0.9 % | INTRAVENOUS | Status: DC
Start: 2022-02-04 — End: 2022-02-16

## 2022-02-04 MED ORDER — LIDOCAINE HCL 1% INJ (MIXTURES ONLY)
1 % | INTRAMUSCULAR | Status: AC
Start: 2022-02-04 — End: ?

## 2022-02-04 MED FILL — SODIUM CHLORIDE 0.9 % IV SOLN: 0.9 % | INTRAVENOUS | Qty: 1000

## 2022-02-04 MED FILL — XYLOCAINE-MPF 1 % IJ SOLN: 1 % | INTRAMUSCULAR | Qty: 30

## 2022-02-04 MED FILL — FENTANYL CITRATE (PF) 100 MCG/2ML IJ SOLN: 100 MCG/2ML | INTRAMUSCULAR | Qty: 2

## 2022-02-04 MED FILL — MIDAZOLAM HCL 2 MG/2ML IJ SOLN: 2 MG/ML | INTRAMUSCULAR | Qty: 2

## 2022-02-04 NOTE — Procedures (Signed)
RADIOLOGY POST PROCEDURE NOTE     Feb 04, 2022       1:25 PM     Preoperative Diagnosis:   Multiple myeloma.    Postoperative Diagnosis:  Same.    Operator:  Dr. Cleon Dew    Assistant:  None.    Type of Anesthesia: 1% local lidocaine and IV moderate sedation with Versed and fentanyl.    Procedure/Description: Image guided bone marrow biopsy.    Findings:   No bleeding.    Estimated blood Loss: Minimal    Specimen Removed: Yes    Blood transfusions:  None.    Implants:  None.    Complications: None    Condition: Stable    Discharge Plan: Continue present therapy.  Resume blood thinners if any in 24 hours.      Berneice Heinrich, MD

## 2022-02-04 NOTE — Progress Notes (Signed)
Referral and all apppropriate office notes faxed to [place] [facility] @ [phone]. Office # [phone]

## 2022-02-04 NOTE — Discharge Instructions (Signed)
Bone Marrow Aspiration and Biopsy: What to Expect at Home  Your Recovery  The biopsy site may feel sore for several days. You may have a bruise on the site. It can help to walk, take pain medicine, and put ice packs on the site. You will probably be able to return to work and your usual activities the day after the procedure. Your doctor or nurse will call you with the results of your test.  This care sheet gives you a general idea about how long it will take for you to recover. But each person recovers at a different pace. Follow the steps below to get better as quickly as possible.  How can you care for yourself at home?  Activity    Rest when you feel tired. Getting enough sleep will help you recover.     You may drive when you are no longer taking pain pills and can quickly move your foot from the gas pedal to the brake. You must also be able to sit comfortably for a long period of time, even if you do not plan to go far. You might get caught in traffic.     Most people are able to return to work the day after the procedure.   Medicines    Your doctor will tell you if and when you can restart your medicines. He or she will also give you instructions about taking any new medicines.     If you stopped taking aspirin or some other blood thinner, your doctor will tell you when to start taking it again.     Be safe with medicines. Take pain medicines exactly as directed.  If the doctor gave you a prescription medicine for pain, take it as prescribed.  If you are not taking a prescription pain medicine, take an over-the-counter medicine such as acetaminophen (Tylenol), ibuprofen (Advil, Motrin), or naproxen (Aleve). Read and follow all instructions on the label.  Do not take two or more pain medicines at the same time unless the doctor told you to. Many pain medicines have acetaminophen, which is Tylenol. Too much acetaminophen (Tylenol) can be harmful.     If you think your pain medicine is making you sick to  your stomach:  Take your medicine after meals (unless your doctor has told you not to).  Ask your doctor for a different pain medicine.     If your doctor prescribed antibiotics, take them as directed. Do not stop taking them just because you feel better.   Ice    Put ice or a cold pack on the biopsy site for 10 to 20 minutes at a time. Put a thin cloth between the ice and your skin.   Follow-up care is a key part of your treatment and safety. Be sure to make and go to all appointments, and call your doctor if you are having problems. It's also a good idea to know your test results and keep a list of the medicines you take.  When should you call for help?   Call 911 anytime you think you may need emergency care. For example, call if:    You passed out (lost consciousness).   Call your doctor now or seek immediate medical care if:    You have signs of infection, such as:  Increased pain, swelling, warmth, or redness.  Red streaks leading from the biopsy site.  Pus draining from the biopsy site.  Swollen lymph nodes in your neck, armpits, or groin.    A fever.   Watch closely for any changes in your health, and be sure to contact your doctor if:    You are not getting better as expected.   Where can you learn more?  Go to https://www.bennett.info/ and enter E148 to learn more about "Bone Marrow Aspiration and Biopsy: What to Expect at Home."  Current as of: Feb 07, 2021               Content Version: 13.6   2006-2023 [employer].   Care instructions adapted under license by [facility]. If you have questions about a medical condition or this instruction, always ask your healthcare professional. [place] any warranty or liability for your use of this information.

## 2022-02-04 NOTE — Progress Notes (Signed)
TRANSFER - OUT REPORT:    Verbal report given to Joy RN on Bradley Jackson  being transferred to [facility] for routine post-op       Report consisted of patient's Situation, Background, Assessment and   Recommendations(SBAR).     Information from the following report(s) Nurse Handoff Report was reviewed with the receiving nurse.  Kinder Assessment: No data recorded  Lines:   Peripheral IV 02/04/22 Right Antecubital (Active)        Opportunity for questions and clarification was provided.      Patient transported with:  Registered Nurse

## 2022-02-04 NOTE — Progress Notes (Signed)
Patient arrived back on the unit via gurney by Nurse Langley Gauss. Patient hooked up to monitor and alert and oriented. Checked bone marrow biopsy site. Clean, dry and intact 2x2 gauze with Tegaderm pressure dressing on site.

## 2022-02-04 NOTE — Progress Notes (Signed)
74    AVS Discharge instructions reviewed with patient and copy given to patient.  All questions answered.  Patient verbalized understanding to all discharge instructions.  PIV removed. Procedural site within normal limits.  No hematoma or bleeding noted from procedural and PIV site. No pain noted at discharge.  Patient discharged with support person in stable condition.  Escorted out to vehicle for transport home.

## 2022-02-08 ENCOUNTER — Encounter: Payer: PRIVATE HEALTH INSURANCE | Attending: Internal Medicine | Primary: Internal Medicine

## 2022-02-15 ENCOUNTER — Encounter: Payer: PRIVATE HEALTH INSURANCE | Attending: Internal Medicine | Primary: Internal Medicine

## 2022-02-15 NOTE — Telephone Encounter (Signed)
Pt requesting return call to discuss Iron treatment due to red blood count results

## 2022-02-15 NOTE — Telephone Encounter (Signed)
Please advise? Patient is wondering about IV iron at this time.

## 2022-02-20 ENCOUNTER — Encounter: Payer: PRIVATE HEALTH INSURANCE | Attending: Internal Medicine | Primary: Internal Medicine

## 2022-02-20 NOTE — Telephone Encounter (Signed)
Pt stated he is going to [facility] in June & his case will be taken over from there so he did not see why he needed to make another appt with Dr. Lazaro Arms, he also wanted to know if there was anything that could be done about his Anemia.

## 2022-03-07 ENCOUNTER — Encounter: Payer: PRIVATE HEALTH INSURANCE | Attending: Internal Medicine | Primary: Internal Medicine

## 2022-03-27 ENCOUNTER — Ambulatory Visit
Admit: 2022-03-27 | Discharge: 2022-03-27 | Payer: PRIVATE HEALTH INSURANCE | Attending: Internal Medicine | Primary: Internal Medicine

## 2022-03-27 DIAGNOSIS — C9000 Multiple myeloma not having achieved remission: Secondary | ICD-10-CM

## 2022-03-27 NOTE — Progress Notes (Incomplete)
Hematology/Oncology Note      Date: 03/27/2022     Name: Bradley Jackson  DOB: [birth year]     Bradley Jackson, Bradley Jackson      Subjective:     Chief complaint: Multiple myeloma     History of Present Illness:   Bradley Jackson is a most pleasant 66 y.o. year old male who was seen for Multiple myeloma.   The patient has a past medical history significant of Multiple myeloma.  Upon further history taking, patient reported he was diagnosed monoclonal gammopathy since 2013.  He was diagnosed Multiple myeloma in 2018 after BMBx reported 60% plasma cells.   He was started chemotherapy with VRD. During therapy courses, the patient was admitted d/t uncontrolled DM from dexamethasone.   He reportedly stopped MM therapy (?) in 2021.  He denied Hx BMT.  He reported a repeat BMBx in 05/2021 which reported 5% plasma cell.  He denied any active multiple myeloma since then.  The patient reported he has fu PCP for refractory anemia which was not improved effectively by Procrit/Retacrit.  The patient reported chronic fatigue.   The patient otherwise has no other complaints. Denied fever, chills, night sweat, unintentional weight loss, skin lumps or bumps, acute bleeding or bruising issues. Denied headache, acute vision change, dizziness, chest pain, worsen shortness of breath, palpitation, productive cough, nausea, vomiting, abdominal pain, altered bowel habits, dysuria, worsen bone pain or back pain, new focal numbness or weakness. .       The patient was accompanied by *** during this visit.      The patient denied new complaints or concerns. Denied fever, chills, night sweat, unintentional weight loss, skin lumps or bumps, acute bleeding or bruising issues. No acute bleeding, blood in stool, dark stool, melena, hematochezia, hemoptysis, dark urine, or easily bruising.  Denied headache, acute vision change, dizziness, chest pain, worsen shortness of breath, palpitation, productive cough, nausea, vomiting, abdominal pain, altered bowel habits,  dysuria, worsen bone pain or back pain, new focal numbness or weakness.       Past Medical History, Family History, and Social History:    History reviewed. No pertinent past medical history.  Past Surgical History:   Procedure Laterality Date    IR BIOPSY PERC SUPERF BONE  02/04/2022    IR BIOPSY PERC SUPERF BONE 02/04/2022 Dmitri Herbie Barnum, MD [facility] RAD ANGIO IR     Social History     Tobacco Use    Smoking status: Never    Smokeless tobacco: Never   Vaping Use    Vaping Use: Never used   Substance Use Topics    Alcohol use: Not Currently    Drug use: Never      History reviewed. No pertinent family history.  Current Outpatient Medications   Medication Sig Dispense Refill    atorvastatin (LIPITOR) 80 MG tablet TAKE ONE TABLET BY MOUTH EVERY NIGHT AT BEDTIME -FOR CHOLESTEROL      carvedilol (COREG) 25 MG tablet TAKE ONE TABLET BY MOUTH TWICE A DAY      allopurinol (ZYLOPRIM) 100 MG tablet TAKE ONE TABLET BY MOUTH EVERY DAY FOR GOUT (Patient not taking: Reported on 02/04/2022)      amLODIPine (NORVASC) 10 MG tablet TAKE ONE TABLET BY MOUTH EVERY DAY FOR BLOOD PRESSURE **TAKE WITH EVENING DOSE OF CARVEDILOL**      aspirin 81 MG EC tablet TAKE ONE TABLET BY MOUTH EVERY DAY      vitamin D (CHOLECALCIFEROL) 25 MCG (1000 UT) TABS tablet TAKE  TWO TABLETS BY MOUTH EVERY DAY      metFORMIN (GLUCOPHAGE) 1000 MG tablet TAKE ONE TABLET BY MOUTH TWICE A DAY FOR DIABETES      sildenafil (VIAGRA) 100 MG tablet TAKE ONE-HALF TABLET BY MOUTH AS DIRECTED FOR ERECTILE DYSFUNCTION 30 MINUTES TO 4 HOURS PRIOR TO SEXUAL ACTIVITY. NOT TO EXCEED ONE DOSE/24 HOURS*CANNOT BE REPLACED IF LOST*       No current facility-administered medications for this visit.     Facility-Administered Medications Ordered in Other Visits   Medication Dose Route Frequency Provider Last Rate Last Admin    0.9 % sodium chloride infusion   IntraVENous Continuous Dmitri Herbie Haring, MD           Review of Systems   Constitutional:  Negative for fatigue and fever.    Respiratory:  Negative for cough and shortness of breath.    Cardiovascular:  Negative for chest pain.   Gastrointestinal:  Negative for abdominal pain, diarrhea, nausea and vomiting.   Genitourinary:  Negative for flank pain.   Musculoskeletal:  Negative for back pain, joint swelling and neck pain.   Skin:  Negative for rash.   Neurological:  Negative for syncope, weakness, numbness and headaches.   Hematological:  Negative for adenopathy.   Psychiatric/Behavioral:  Negative for behavioral problems.            Objective:   BP (!) 163/80   Pulse 66   Resp 16   Ht 5\' 10"  (1.778 m)   Wt 214 lb (97.1 kg)   SpO2 99%   BMI 30.71 kg/m     ECOG Performance Status (grade): ***  0 - able to carry on all pre-disease activity w/out restriction  1 - restricted but able to carry out light work  2 - ambulatory and can self- care but unable to carry out work  3 - bed or chair >50% of waking hours  4 - completely disable, total care, confined to bed or chair    Physical Exam  Constitutional:       Appearance: Normal appearance. He is not ill-appearing.   HENT:      Head: Normocephalic and atraumatic.   Cardiovascular:      Rate and Rhythm: Normal rate and regular rhythm.      Pulses: Normal pulses.      Heart sounds: Normal heart sounds.   Pulmonary:      Effort: Pulmonary effort is normal.      Breath sounds: Normal breath sounds.   Abdominal:      General: Bowel sounds are normal.      Palpations: Abdomen is soft.      Tenderness: There is no abdominal tenderness. There is no guarding.   Musculoskeletal:         General: No swelling or tenderness.      Cervical back: Neck supple.   Skin:     General: Skin is warm.      Findings: No erythema or rash.   Neurological:      General: No focal deficit present.      Mental Status: He is alert and oriented to person, place, and time. Mental status is at baseline.   Psychiatric:         Behavior: Behavior normal.            Diagnostics:      No results found for this or any  previous visit (from the past 96 hour(s)).    Imaging:  No valid  procedures specified.    === 01/08/22 ===    XR BONE SURVEY COMPLETE    - Narrative -  BONE SURVEY    CPT code: 72536    Indications: multiple myeloma. Back pain.    Standard radiographs of the axial and the appendicular skeleton are obtained.    Findings:    No lytic lesions are seen to suggest the presence of multiple myeloma.  Degenerative changes are seen in the lumbar spine greatest at the L2-L3 Jackson.  Prominent degenerative changes are seen in the lower thoracic spine. Multilevel  degenerative disc disease and osteoarthritic changes is seen in the cervical  spine. I see no fracture. Osseous mineralization is normal.    - Impression -  No lytic lesions are seen to suggest the presence of multiple  myeloma. Prominent degenerative changes are seen involving the spine.    No results found for this or any previous visit.            PATHOLOGY  Specimen #:  [id number]   Age:  13-Dec-1955 (Age: 66)                        Date of Procedure:   02/04/2022   Sex:  M                           Date of Receipt:  02/04/2022   Hospital#:  [id number]                  Date of Report: 02/07/2022   Med. Record #:  [id number]   Location:  [facility] RAD ANGIO IR [facility])   Room/Bed:   DANG   Physician(s):  Nell Schrack P. Syris Brookens, MD [phone])   Chanda Busing M.D. [phone])            PRE-OPERATIVE DIAGNOSIS:   Multiple myeloma     TYPE OF SPECIMEN:   A: BONE MARROW ASP   B: BONE MARROW BIOPSY   C: BONE MARROW DIRECT ASP SMEARS   D: BONE MARROW PERIPHERAL BLOOD SMEAR       FINAL DIAGNOSIS:     A-D: PERIPHERAL BLOOD, BONE MARROW ASPIRATE, CLOT SECTION, AND CORE   BIOPSY:        PLASMA CELL NEOPLASM INVOLVING 15% OF A NORMOCELLULAR BONE MARROW   WITH TRILINEAGE MATURING HEMATOPOIESIS.   NO EVIDENCE OF INCREASED BLASTS OR DYSPLASIA IDENTIFIED.   CONGO RED STAIN NEGATIVE FOR AMYLOID DEPOSITION.     DIAGNOSIS COMMENT:   Note is made of the patient's history of multiple myeloma.  Flow cytometry   shows  the presence of a lambda light chain restricted plasma cell   population with atypical phenotype.  Plasma cells constitute approximately   15% of bone marrow cellularity based on CD138 immunohistochemical stain.   In conjunction with the patient's serum IgG lambda monoclonal protein with   M spike of 1.5 g/dL, the overall findings are consistent with bone marrow   involvement by the patient's previously diagnosed plasma cell neoplasm.  A   FISH plasma cell myeloma prognostic panel has been ordered and the results   will be reported in an addendum.     GROSS DESCRIPTION:   A.  Received fresh in two plastic syringes each labeled with the patient   identifiers and "bone marrow aspirate", is approximately 2.4 mL of clotted   blood.  The clots are sectioned and are submitted entirely in cassette A1.  B.  Received in formalin and accompanied by four touch preps and labeled   with patient identifiers and "bone marrow biopsy", are three hemorrhagic   bone core biopsies measuring 0.8 cm to 1.5 cm in length.  The specimen is   submitted in cassette B1 after decalcification in EDTA solution.       C.  The specimen consists of 16 direct smears.       D.  The specimen consists of two peripheral blood smears.     One purple top and one green top tube are received with the specimen.  The   tubes are forwarded to [facility] for flow cytometry and cytogenetic   studies.     [facility] 02/04/2022 03:05 PM     AJA/dh3       MICROSCOPIC DESCRIPTION:   PERIPHERAL BLOOD SMEAR MORPHOLOGY:   Peripheral blood smears show a mild normochromic, normocytic anemia with   mild anisocytosis.  Neutrophils are adequate with unremarkable morphology.    Lymphocytes and monocytes are morphologically unremarkable.  Platelets   are adequate with unremarkable morphology.  No atypical cell population or   circulating plasma cells are identified.     BONE MARROW ASPIRATE AND TOUCH PREPARATIONS:   Megakaryocytes: Megakaryocytes are morphologically unremarkable.    Myeloid Cells: Myeloid precursors are present in normal proportions and   show progressive maturation.   Erythroid Precursors: Erythroid precursors are present in normal   proportions and show normal maturation.   M:E Ratio: 3:1.   Dysplasia: No dysplasia identified.   Lymphoid Morphology: Lymphocytes are small and mature.   Blast Percentage: 1%.   Lymphocyte Percentage: 3%.   Plasma cell Percentage: 16%.     BONE MARROW CORE BIOPSY AND CLOT SECTIONS:   Cellularity: 40% cellular.   Fibrosis: No fibrosis identified.   Myeloid Elements: Myeloid elements are adequate with unremarkable   morphology.   Erythroid Elements: Erythroid elements are adequate with unremarkable   morphology.   Megakaryocytes: Megakaryocytes are adequate with unremarkable morphology.   Lymphoid aggregates: No lymphoid aggregates identified.     BONE MARROW ANCILLARY STUDIES:   Cytochemistry: A reticulin stain performed on the bone marrow biopsy is   negative for fibrosis.  Iron stains performed on the clot section and an   aspirate smear demonstrate insufficient marrow elements to evaluate iron   stores and are negative for ringed sideroblasts.  Congo red stain   performed on the core biopsy is negative for amyloid deposition.   Flow Cytometry: See attached report.   Immunohistochemistry: Immunohistochemical stain for CD138 on the core   biopsy highlights plasma cells present in aggregates constituting   approximately 15% the bone marrow cellularity.   FISH: Plasma cell myeloma prognostic panel pending.   Cytogenetics: Pending.   Molecular Studies: Not performed.     Special stains: Block A1 and aspirate smear - irons stains; two individual   stained slides.               Block B1 - reticulin and congo red stains; two individual   stained slides.     IHC summary: Block B1 -antibody to CD138; one individual stained slide.     Battery Test Code Clinical Result Ref. Range Units Result Date Lab         CBC W Automated Differential WBC 7.9 4.6-13.2  K/uL 02/04/2022 11:09 MCL                   RBC L 4.12 4.35-5.65 M/uL 02/04/2022  11:09                   Hemoglobin L 11.2 13.0-16.0 g/dL 10/12/1094 04:54                   Hematocrit L 35.1 36.0-48.0 % 02/04/2022 11:09                   MCV 85.2 78.0-100.0 FL 02/04/2022 11:09                   MCH 27.2 24.0-34.0 PG 02/04/2022 11:09                   MCHC 31.9 31.0-37.0 g/dL 0/06/8118 14:78                   RDW H 18.6 11.6-14.5 % 02/04/2022 11:09                   PLATELET 242 135-420 K/uL 02/04/2022 11:09                   MPV 9.6 9.2-11.8 FL 02/04/2022 11:09                   NRBC 0.0 0 PER 100 WBC 02/04/2022 11:09                   ABSOLUTE NRBC 0.00 0.00-0.01 K/uL 02/04/2022 11:09                   Neutrophils 48 40-73 % 02/04/2022 11:09                   Lymphocytes 41 21-52 % 02/04/2022 11:09                   Monocytes 9 3-10 % 02/04/2022 11:09                   Eosinophils 1 0-5 % 02/04/2022 11:09                   Basophils 1 0-2 % 02/04/2022 11:09                   Immature Granulocytes H 1 0.0-0.5 % 02/04/2022 11:09                   ABSOLUTE NEUTS 3.8 1.8-8.0 K/UL 02/04/2022 11:09                   ABSOLUTE LYMPHS 3.2 0.9-3.6 K/UL 02/04/2022 11:09                   ABSOLUTE MONOS 0.7 0.05-1.2 K/UL 02/04/2022 11:09                   Absolute EOS 0.1 0.0-0.4 K/UL 02/04/2022 11:09                   Absolute Basos 0.0 0.0-0.1 K/UL 02/04/2022 11:09                   ABSOLUTE IMMATURE GRANS 0.0 0.00-0.04 K/UL 02/04/2022 11:09                             Cytogenetics Blood/Bone M (WB)  Interpretation   Karyotype: 45,X,-Y[4]/46,XY[16]     Interpretation:   ABNORMAL MALE KARYOTYPE - SEE BELOW     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 The   remaining sixteen cells show a normal male karyotype.     Loss of the Y chromosome is an age-related change in older males, and is   not considered clinically significant.     Recommendation:   Please note concurrent flow results.     Results-Comments   Comments:    Standard cytogenetic analysis may not detect subtle submicroscopic   rearrangements and may not include metaphases from abnormal cell   populations with low mitotic rates or present in low levels.       Electronic Signature                                    Electronic   Signature   Midge Aver, M.S., Ph.D., ABMG (CC), [facility].                                    Vilinda Flake,   M.D., Pathologist     The Accessioning Component, Technical Component Processing and Analysis of   this test was completed at [facility], [address] [place], [place] / [address] / [phone] / CLIA #[id number] / Laboratory   Director(s): Clearnce Sorrel, MD. The Professional Component of this test   was completed at [facility], [address], [place], [place] /   [address] / [phone] / CLIA #[id number] / Laboratory Director(s):   Vilinda Flake, M.D.                       Benjamine Mola, M.D.   Electronically Signed   02/15/2022         Flow Cytometry Blood/Marrow (WB)                              Interpretation   Monoclonal plasma cells detected.     Results-Comments   Comments:   Flow detects 0.4% monoclonal plasma cells. Clonal plasma cells may be seen   with monoclonal gammopathy of undetermined significance (MGUS),   plasmacytoma, as well as plasma cell myeloma. Plasma cell counts may vary   due to sampling and degeneration of the specimen. No immunophenotypic   evidence of a lymphoproliferative disorder, acute leukemia, or increase in   blasts is identified. Correlation with morphologic findings, along with   clinical, radiological and SPEP/UPEP information is necessary for further   classification. If clinically indicated, cytogenetics and FISH may provide   additional prognostic information.     Flow Differential (%) and Population Analysis:   Lymphocytes: 32.8%   T-cells (75% of lymphoid cells) show a CD4/CD8 ratio about 5.0 without   overt phenotypic abnormality.  NK-cells (9% of lymphoid cells) are   unremarkable. Mature B-cells (14% of lymphoid cells) are polyclonal   (kappa:lambda 1.4).     Monocytes: 7.4%   Monocytes co-express CD14 and CD64 without phenotypic abnormalities.     Granulocytes: 55.9%   Granulocytes show phenotypic evidence of maturation without dysmaturation.     CD45 Dim: 1.1%   CD34+ cells (0.4% of total cells) are not increased. Precursor  B-cells are   unremarkable.     Plasma Cells: 0.4%   Plasma cells show cytoplasmic lambda light chain restriction and show:   CD19 neg, CD38 bri, CD45 neg, CD56 pos, CD138 mod and cLambda mod.       Markers Performed:   CD2, CD3, CD4, CD5, CD7, CD8, CD10, CD11c, CD13, CD14, CD16, CD19, CD20,   CD23, CD33, CD34, CD38, CD45, CD56, CD64, CD117, CD138, cKappa, cLambda,   HLA-DR, Kappa, Lambda (27 Markers)     CPT Codes:   16109U0, 45409W11       Electronic Signature   Kirt Boys, M.D., Pathologist     The Accessioning Component and Technical Component Processing of this test   was completed at [facility], [address] [place], [place] /   [address] / [phone] / CLIA # [id number] Norma Fredrickson Director(s): Clearnce Sorrel, MD. The Technical Component Analysis of this test was completed   at [facility], [address], [place], [place] / [address] /   [phone] / CLIA # [id number] / Laboratory Director(s): Vilinda Flake, M.D. The Professional Component of this test was completed at   [facility], [address] [place], [place]   [address] / Phone: [phone] / Fax: [phone].                               Benjamine Mola, M.D.   Electronically Signed   02/07/2022         Misc. 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      Fluorescence in situ hybridization (FISH) analysis was performed using a   Plasma Cell Myeloma Prognostic specific probe panel. Plasma cell   enrichment was performed unless otherwise noted. 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   Counts for all other probes were within the normal reference range. This   finding represents an ABNORMAL result.     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 Deletion of 13q is a frequent finding by interphase   FISH analysis and although it was previously considered an adverse   prognostic marker, based on more recent data and due to evolving therapy,   it is now considered standard risk indicator. Correlation with   conventional cytogenetics is recommended because deletion 13 remains a   poor prognostic indicator, if detected by that methodology (2).     References:   1. Chng WJ, Dispenzieri A, et al; International Myeloma Working Group.   IMWG consensus on risk stratification in multiple myeloma. Leukemia.   2014;28(2):269-77.   2. Genevie Ann, Dingli D, Harland German, et al; [facility]. Management of newly   diagnosed symptomatic multiple myeloma: updated Mayo Stratification of   Myeloma and Risk-Adapted Therapy (mSMART) consensus guidelines 2013. Mayo   Clin Proc.  2013;88(4):360-76.     Probe Set Detail:   13q-/-13: Deletion of both 13q14 and 13q34 resulting in a signal  pattern   of 1R1G was observed. 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   1p32/1q21: Probe set 1 showed a normal 2R2G signal pattern within the   normal reference range.   p53 (17p13.1)/ NF1 (17q11): The probe set 17 shows a normal 2R2G signal   pattern. This represents a NORMAL result.   CCND1/IgH t(11;14): All probe signals were within the normal reference   range. This represents a NORMAL result.   FGFR3/IgH t(4;14): A normal FISH signal  pattern of 2R2G was observed. This   is a NORMAL result negative for FGFR3/IgH fusion.   IgH/MAF t(14;16): Normal signal pattern 2R2G was observed. This is a   NORMAL result negative for IgH/MAF fusion.   IgH/MAFB t(14;20): A normal FISH signal pattern of 2R2G was observed. This   represents a NORMAL result that is negative for the IGH/   MAFB fusion.     Results-Comments   Comments:   The above pathologist interpretation is obtained from evaluation and   analysis of technologist derived data with subsequent comparison with   [facility] cut off levels to determine if chromosomal   abnormalities are present. For quality assurance purposes, the pathologist   reviews select cell and probe images to correlate with obtained   technologist data.   The technical component of FISH study was performed by [facility]. The professional component was performed at [facility] by Wyline Copas MD PhD, Pathologist,   [facility], [place], [place] ([place]   ZO#[id number]).     Nuclei Scored: 100 - 129     Probe set                                    Scoring method           CPT   Code      # of Units   13q-/-13, 1p32/1q21, p53 (17p13.1)/ NF1 (17q11), CCND1/IgH t(11;14),       Computer Assisted          [phone]           7   FGFR3/IgH t(4;14), IgH/MAF t(14;16), IgH/MAFB t(14;20)          Technology     Electronic Signature   Leeanne Deed, M.D., Pathologist     All controls were within expected ranges.   The Accessioning Component and Technical Component Processing of this test   was completed at [facility], [address] [place], [place] /   [address] / [phone] / CLIA # [id number] / Laboratory Director(s): Clearnce Sorrel, MD. The Technical Component Analysis of this test was completed   at [facility], [address], [address],   [place], [place] /   [address] / [phone] / CLIA # [id number] / Laboratory  Director(s): Mable Paris, MD. The Professional Component of this test was completed at [facility], [address], [place], [place] [address] /   Phone: [phone] / Fax: [phone].                   Cliffton Asters Eddie Candle, M.D.   Electronically Signed   02/21/2022  Benjamine Mola, M.D./aja         Benjamine Mola, M.D.   ****Electronically Signed****   02/07/2022             Diagnosis:                                        1. Multiple myeloma not having achieved remission (HCC)    2. Refractory anemia (HCC)        Assessment and Plan:                                          # Multiple Myeloma  # Refractory Anemia    -- Past medical history significant of Multiple myeloma.  -- Patient reported he was diagnosed monoclonal gammopathy since 2013.  -- He was diagnosed Multiple myeloma in 2018 after BMBx reported 60% plasma cells.   -- He was started chemotherapy with VRD. During therapy courses, the patient was admitted d/t uncontrolled DM from dexamethasone.   -- He reportedly stopped MM therapy (?) in 2021. Denied Hx BMT.  -- He reported a repeat BMBx in 05/2021 which reported 5% plasma cell.  -- He denied any active multiple myeloma since then.  -- The patient reported he has fu PCP for refractory anemia which was not improved effectively by Procrit/Retacrit.  -- 12/12/2021 CBC reported hemoglobin 10.1, hematocrit 32.8%, WBC 5.9, platelet 276,000.  BUN 20, creatinine 1.59  Iron saturation 17%, TIBC 419, folate> 20, B12 393.  MMA 143.  LDH 159  Serum protein electrophoresis reported M spike 1.5, immunofixation shows IgG monoclonal protein with lambda light chain specificity, IgG 2329.  Serum free kappa/lambda ratio 0.57.  Serum free lambda light chain 262, kappa/lambda ratio 0.12.  Urine protein electrophoresis reported M spike 19.2%, an additional M spike was observed at a concentration of 3.2 %.  Urine kappa/lambda ratio 0.57.  -- 01/08/2022 skeletal survey reported no  lytic lesions are seen to suggest the presence of multiple  myeloma. Prominent degenerative changes are seen involving the spine.    [place]  Out of town and R/S follow up visis        [facility] [place] [facility]  Interested in [facility] consultation for BMBx  Reviewedrecent BMBx which suggested  I explained I am leaving and [facility]  pATIENT WOULD LIKE TO ESTABLISH CARE WITH NEW LOCAL ONCOLOGIST [place]  [facility]    WILL APPROVE FOR [facility]      Plan:  -- The patient will bring previous records for chart review  -- BMBx given concerned relapsed MM  -- [facility] Referral per requested.   -- All patient's questions answered, the patient was agreeable with the plan.  -- We will see the patient back in about 3-4 weeks for report review. Always sooner if required.         Follow-up:   -- All of patient's questions answered. The patient was agreeable with the plan.  -- Return for a follow up visit in about *** months. Always sooner if required.        No orders of the defined types were placed in this encounter.          Mr. Nareg Achuff has a reminder for a "due or due  soon" health maintenance. I have asked that he contact his primary care provider for follow-up on this health maintenance.   All of patient's questions answered to their apparent satisfaction. They verbally show understanding and agreement with aforementioned plan.         Leane Call Lasean Gorniak, MD  03/27/2022         Above mentioned total time spent for this encounter with more than 50% of the time spent in face-to-face counseling, discussing on diagnosis and management plan going forward, and co-ordination of care.  Parts of this document has been produced using Dragon dictation system.  Unrecognized errors in transcription may be present. Please Khyree Carillo not hesitate to reach out for any questions or clarifications.      CC: Bradley Jackson, Latora Quarry,

## 2022-03-27 NOTE — Telephone Encounter (Signed)
Per Dr. Lazaro Arms, called to check with pt to see if he has established care with other oncology office yet. Per pt, we are his oncologist but he would like to discuss seeing oncologist in [place]. Advised pt will discuss today at visit w Dr. Do/
